# Patient Record
Sex: Female | Born: 1935 | Race: Black or African American | Hispanic: No | Smoking: Never smoker
Health system: Southern US, Community
[De-identification: ages and names within clinical notes are randomized; demographics above are authoritative.]

## PROBLEM LIST (undated history)

## (undated) DIAGNOSIS — I739 Peripheral vascular disease, unspecified: Secondary | ICD-10-CM

## (undated) DIAGNOSIS — M1990 Unspecified osteoarthritis, unspecified site: Secondary | ICD-10-CM

## (undated) DIAGNOSIS — E785 Hyperlipidemia, unspecified: Secondary | ICD-10-CM

## (undated) DIAGNOSIS — I779 Disorder of arteries and arterioles, unspecified: Secondary | ICD-10-CM

## (undated) DIAGNOSIS — I6529 Occlusion and stenosis of unspecified carotid artery: Secondary | ICD-10-CM

## (undated) DIAGNOSIS — I2510 Atherosclerotic heart disease of native coronary artery without angina pectoris: Secondary | ICD-10-CM

## (undated) DIAGNOSIS — I10 Essential (primary) hypertension: Secondary | ICD-10-CM

## (undated) HISTORY — DX: Essential (primary) hypertension: I10

## (undated) HISTORY — DX: Peripheral vascular disease, unspecified: I73.9

## (undated) HISTORY — PX: DOPPLER ECHOCARDIOGRAPHY: SHX263

## (undated) HISTORY — DX: Hyperlipidemia, unspecified: E78.5

## (undated) HISTORY — DX: Disorder of arteries and arterioles, unspecified: I77.9

## (undated) HISTORY — PX: ULTRASOUND GUIDANCE FOR VASCULAR ACCESS: SHX6516

## (undated) HISTORY — DX: Occlusion and stenosis of unspecified carotid artery: I65.29

## (undated) HISTORY — DX: Atherosclerotic heart disease of native coronary artery without angina pectoris: I25.10

---

## 1994-05-20 HISTORY — PX: CORONARY STENT PLACEMENT: SHX1402

## 1997-12-24 ENCOUNTER — Inpatient Hospital Stay (HOSPITAL_COMMUNITY): Admission: AD | Admit: 1997-12-24 | Discharge: 1997-12-25 | Payer: Self-pay | Admitting: Cardiovascular Disease

## 1999-03-16 ENCOUNTER — Other Ambulatory Visit: Admission: RE | Admit: 1999-03-16 | Discharge: 1999-03-16 | Payer: Self-pay | Admitting: *Deleted

## 1999-03-18 ENCOUNTER — Other Ambulatory Visit: Admission: RE | Admit: 1999-03-18 | Discharge: 1999-03-18 | Payer: Self-pay | Admitting: *Deleted

## 1999-03-18 ENCOUNTER — Encounter (INDEPENDENT_AMBULATORY_CARE_PROVIDER_SITE_OTHER): Payer: Self-pay | Admitting: Specialist

## 1999-06-14 ENCOUNTER — Encounter: Payer: Self-pay | Admitting: Cardiovascular Disease

## 1999-06-15 ENCOUNTER — Inpatient Hospital Stay (HOSPITAL_COMMUNITY): Admission: RE | Admit: 1999-06-15 | Discharge: 1999-06-16 | Payer: Self-pay | Admitting: Cardiovascular Disease

## 1999-09-17 ENCOUNTER — Other Ambulatory Visit: Admission: RE | Admit: 1999-09-17 | Discharge: 1999-09-17 | Payer: Self-pay | Admitting: *Deleted

## 1999-09-24 ENCOUNTER — Ambulatory Visit (HOSPITAL_COMMUNITY): Admission: RE | Admit: 1999-09-24 | Discharge: 1999-09-24 | Payer: Self-pay | Admitting: *Deleted

## 1999-09-24 ENCOUNTER — Encounter: Payer: Self-pay | Admitting: *Deleted

## 2000-09-11 ENCOUNTER — Other Ambulatory Visit: Admission: RE | Admit: 2000-09-11 | Discharge: 2000-09-11 | Payer: Self-pay | Admitting: *Deleted

## 2002-01-28 ENCOUNTER — Other Ambulatory Visit: Admission: RE | Admit: 2002-01-28 | Discharge: 2002-01-28 | Payer: Self-pay | Admitting: *Deleted

## 2002-02-17 ENCOUNTER — Inpatient Hospital Stay (HOSPITAL_COMMUNITY): Admission: EM | Admit: 2002-02-17 | Discharge: 2002-02-19 | Payer: Self-pay | Admitting: Emergency Medicine

## 2002-02-17 ENCOUNTER — Encounter: Payer: Self-pay | Admitting: Emergency Medicine

## 2002-06-02 ENCOUNTER — Other Ambulatory Visit: Admission: RE | Admit: 2002-06-02 | Discharge: 2002-06-02 | Payer: Self-pay | Admitting: *Deleted

## 2003-06-09 ENCOUNTER — Other Ambulatory Visit: Admission: RE | Admit: 2003-06-09 | Discharge: 2003-06-09 | Payer: Self-pay | Admitting: *Deleted

## 2004-10-11 ENCOUNTER — Other Ambulatory Visit: Admission: RE | Admit: 2004-10-11 | Discharge: 2004-10-11 | Payer: Self-pay | Admitting: *Deleted

## 2007-02-16 ENCOUNTER — Inpatient Hospital Stay (HOSPITAL_COMMUNITY): Admission: AD | Admit: 2007-02-16 | Discharge: 2007-02-19 | Payer: Self-pay | Admitting: Cardiovascular Disease

## 2007-06-10 ENCOUNTER — Encounter: Admission: RE | Admit: 2007-06-10 | Discharge: 2007-06-10 | Payer: Self-pay | Admitting: Cardiovascular Disease

## 2007-06-16 ENCOUNTER — Inpatient Hospital Stay (HOSPITAL_COMMUNITY): Admission: RE | Admit: 2007-06-16 | Discharge: 2007-06-19 | Payer: Self-pay | Admitting: Cardiovascular Disease

## 2010-10-02 NOTE — Cardiovascular Report (Signed)
NAMESHELL, BLANCHETTE NO.:  [id number]   MEDICAL RECORD NO.:  [id number]          PATIENT TYPE:  INP   LOCATION:  2028                         FACILITY:  [facility]   PHYSICIAN:  Nanetta Batty, M.D.   DATE OF BIRTH:  1929-02-19   DATE OF PROCEDURE:  06/16/2007  DATE OF DISCHARGE:                            CARDIAC CATHETERIZATION   PROCEDURES:  1. Peripheral angiogram.  2. Percutaneous transluminal angioplasty.  3. Stent procedure.   Ms. Hamler is a 77 year old African-American female with history of  CAD and PVOD.  Other problems include mild anemia, dyslipidemia,  hypertension and noncritical carotid disease.  She was complaining of  claudication in her left calf.  We performed Dopplers in our office  revealing what appeared to be high-grade left superficial femoral artery  disease on May 27, 2007.  Her ABI on the left was 0.88 on the right  1.06.  She presents today for abdominal aortography, bifemoral runoff to  define her anatomy, and potentially perform endovascular therapy for  lifestyle-limiting claudication.   DESCRIPTION OF PROCEDURE:  The patient brought to the second floor [facility]  peripheral vascular angiographic suite in the postabsorptive state.  She  was premedicated p.o. Valium.  Right groin was prepped and shaved in the  usual sterile fashion; 1% Xylocaine was used for local anesthesia.  A 5-  French sheath was inserted into the right femoral artery using standard  Seldinger technique.  A 5-French pigtail catheter, crossover end-hole  catheters were used for midstream and distal abdominal aortography with  bifemoral runoff using digital subtraction bolus chase step table  technique in each individual leg.  Visipaque dye was used for the  entirety of the case.  Aortic pressures were monitored during the case.   ANGIOGRAPHIC RESULTS:  1. Abdominal aorta.      a.     Renal arteries:  Normal.      b.     Infrarenal abdominal aorta:  Normal.  2. Left lower extremity:      a.     90% tandem distal left superficial femoral artery stenoses.      b.     Two-vessel runoff with an occluded anterior tibial.  3. Right lower extremity:      a.     A 50% segmental mid-right superficial femoral artery       stenosis.      b.     Two-vessel runoff with occluded anterior tibial.  The       peroneal was dominant on this side.   DESCRIPTION OF PROCEDURE:  The patient received 3000 units of heparin  intravenously.  Contralateral access was obtained with a crossover  catheter, 0.035 Wholly wire.  The 6-French sheath was exchanged for a 7-  [place] crossover trimmer sheath.  The patient received 3000 units of  heparin.  Visipaque dye was used for the entirety of the case.   The guidewire was advanced across the distal stenoses.  Redilatation was  performed with a 4 x 2 Agiltrac balloon nominal pressures.  Following  this, stenting was performed with an EV-3 6 x 2 nitinol  self-expanding  stent in the distal lesion followed by a 6 x 3 stent in the more  proximal lesion.  They were postdilated with a 5.2 balloon resulting in  reduction of 90% distal fairly focal tandem stenoses to 0% residual  without residual stenosis.  There was excellent flow.  The patient  tolerated the procedure well.  The sheath was then withdrawn across the  iliac bifurcation, and an ACT was measured at greater than 200.  The  patient left lab in stable condition.  Plans will be to remove the  sheath once ACT falls below 200.  Pressure will be held.  The patient  will remain recumbent for 6 hours.  She will be gently hydrated,  discharged in the morning.  She will get followup Doppler ABIs after  which time I will see her back in the office for followup.  She left the  lab in stable condition.      Nanetta Batty, M.D.  Electronically Signed     JB/MEDQ  D:  06/16/2007  T:  06/16/2007  Job:  [id number]   cc:   2nd Fl.[facility] Peripheral Angiographiic Suite  [facility]  Wyatt Haste, MD, [place]

## 2010-10-02 NOTE — Discharge Summary (Signed)
NAMENOA, GALVAO NO.:  [id number]   MEDICAL RECORD NO.:  [id number]          PATIENT TYPE:  INP   LOCATION:  3703                         FACILITY:  [facility]   PHYSICIAN:  Ritta Slot, MD     DATE OF BIRTH:  07-26-1928   DATE OF ADMISSION:  02/16/2007  DATE OF DISCHARGE:  02/19/2007                               DISCHARGE SUMMARY   HOSPITAL COURSE:  [name] is a 77 year old African American female,  a patient of Dr. Nanetta Batty, who was transferred from [facility] to [facility] for cardiac catheterization.  She has a  history of coronary artery disease with multiple stents and a history of  an MI in the past.  Her last cath was February 18, 2002; she had patent  stent at that time.  She did have proximal mid circumflex 50% before her  stent.  She had some 50% in-stent restenosis.  She had bifurcation LAD  and diagonal disease, 60% and 90%.  Apparently it was IV ultrasound.  Her LAD was not high-grade.  No intervention was done.  Her last Myoview  was August 27, 2005, which showed an EF of 80%.  She had no ischemia.  She apparently was at home cooking for a church cookout at her house.  She suddenly had severe squeezing chest pain with diaphoresis and then  syncope.  Her level of consciousness waxed and waned until the ambulance  came, when she was more clear.  She was still having squeezing chest  pain.  She was admitted to her [facility].  CPK, MBs  and troponins were negative x2.  She also had a D-dimer that was  negative.  A CT of her abdomen was negative for any acute process.  She  had a CT of the chest without contrast that was negative.  She had  carotid Dopplers done which showed a right ICA stenosis of 50% to 70%.  She had a CT of her head that was also negative.  She was seen by Dr.  Dossie Arbour and it was decided that she should undergo cardiac  catheterization.  She was placed on Lovenox, IV  nitroglycerin, because  she was still having some chest soreness, and she went on for a heart  cath on February 17, 2007.  Her cath showed no high-grade stenosis;  however, her LAD past the apex was subtotaled at the terminal portion.  It was decided her stent had minimal irregularities in the circumflex.  It was decided she should undergo nuclear stress testing.  On February 19, 2007, she underwent a treadmill Myoview; she walked into the second  stage 4 minutes 31 seconds.  She had no EKG changes.  The images  revealed no infarct, no ischemia and her EF was 79%.  She was seen by  Dr. Lynnea Ferrier after the test was complete and considered stable for  discharge home.  He did alter her medications while she was here.  Dr.  Lynnea Ferrier felt that she should see Dr. Allyson Sabal in the office next week and  he could decided  if he wanted to place an event monitor on her.  During  her hospital stay, she had no arrhythmias, she had no further syncope or  presyncope.   LABORATORY DATA:  Hemoglobin 12.5, hematocrit 36.4, WBC 6.3, platelets  235,000.  Her sodium was 141, potassium 3.8, chloride 111, CO2 25, BUN  13, creatinine 0.98 and glucose was 180.  INR was 1.  Her cardiac  enzymes were negative x2.  Total cholesterol was 192, LDL was 134, HDL  was 37 and triglycerides were 104.  TSH was 0.609.   DISCHARGE MEDICATIONS:  1. Metoprolol 50 mg twice a day.  2. Lotrel 5/20 once a day.  3. Demadex 10 mg once a day.  4. Fish oil one capsule in the morning and the p.m.  5. Multivitamin tab one every day.  6. Aspirin 81 mg every day.  Nitroglycerin 1/150 under her tongue      every 5 minutes x3 if needed for chest pain.   DISCHARGE DIAGNOSES:  1. Syncope, probably vasovagal.  2. Chest pain, not cardiac ischemia with a cardiac catheterization      showing no high-grade disease and followup treadmill Myoview      showing no infarct and no ischemia.  Ejection fraction was 79%.  3. Hyperlipidemia, intolerant of  all statins.  4. Hypertension.  5. Intolerance to Plavix.  6. Arteriosclerotic cardiovascular disease with a right internal      carotid artery stenosis of 50% to 70%.  7. Elevated end-diastolic pressure during catheterization of 33.      [name], N.P.      Ritta Slot, MD  Electronically Signed    BB/MEDQ  D:  02/19/2007  T:  02/20/2007  Job:  [id number]   cc:   [facility] Wyatt Haste MD

## 2010-10-02 NOTE — Cardiovascular Report (Signed)
[name], [name]             ACCOUNT NO.:  [id number]   MEDICAL RECORD NO.:  [id number]          PATIENT TYPE:  INP   LOCATION:  3703                         FACILITY:  [facility]   PHYSICIAN:  Thereasa Solo. Little, M.D. DATE OF BIRTH:  12-20-1928   DATE OF PROCEDURE:  02/17/2007  DATE OF DISCHARGE:                            CARDIAC CATHETERIZATION   INDICATIONS FOR TEST:  This 77 year old female has a long history of  coronary disease with stents in her RCA in 1999 and stents in her  circumflex in 2001.  She had an episode of shortness of breath,  diaphoresis and syncope.  Her cardiac markers have been unremarkable.  Her EKG shows no acute changes and she is brought to the cath lab to  rule out ischemia as the explanation for the event.   The patient was asymptomatic, having no pain.   After obtaining informed consent, the patient was prepped and draped in  the usual sterile fashion, exposing the right groin.  Following local  anesthetic with 1% Xylocaine, the Seldinger technique was employed and a  5-French introducer sheath was placed to the right femoral artery.  Left  and right coronary arteriography, ventriculography in the RAO projection  and a distal aortogram were performed.   COMPLICATIONS:  None.   TOTAL CONTRAST USED:  125 mL.   EQUIPMENT:  The 5-French Judkins configuration catheters.  However, a No-  Toe catheter was required to cannulate the RCA.   RESULTS:   HEMODYNAMIC MONITORING:  Her central aortic pressure was 210/108.  Her  left ventricular pressure was 205/29.  She was given 20 mg of IV  labetalol and her systolic pressure gradually decreased to 140.   VENTRICULOGRAPHY:  Ventriculography in the RAO projection using 25 mL of  contrast at 12 mL per second revealed marked ectopy.  There was ectopy-  induced mild mitral regurgitation seen.  The left ventricle was markedly  trabeculated.  The ejection fraction was well in excess of 60% and there  was very  tense mitral annular calcification noted on fluoroscopy.   DISTAL AORTOGRAM:  A distal aortogram done at the level of the renal  arteries showed no evidence of renal artery stenosis and no evidence of  an aortic aneurysm.   CORONARY ARTERIOGRAPHY:  1. The left main was normal; it bifurcated.  2. Circumflex:  The circumflex was a very large vessel; it gave rise      to a very large proximal first OM that was at least 3 mm in      diameter, very tortuous and it bifurcated.  There were minor      irregularities in this system.  The ongoing circumflex and OM #2      were both large vessels.  There was a stent in the ongoing      circumflex before the OM #2 that had only minimal irregularities.      The distal OM was free of significant disease.  3. LAD:  The LAD was tortuous.  It extended down and well past the      apex of the heart.  At the  terminal portion of the LAD was an area      that was subtotaled.  This was too small for any type of      intervention; the vessel at that point was about 1 mm in diameter.      The proximal LAD had 30% areas of narrowing and some mild      irregularities in the midportion.  The diagonal was free of      disease.  4. Right coronary artery:  This was a very tortuous vessel.  There was      a stent right at the acute margin of the heart that was patent.      The was an area of 40% narrowing in the midportion and an area of      50% narrowing distal to the stent.  The ostium of the PDA had an      area of 40% narrowing.  The PDA was tortuous and free of high-grade      stenosis.   CONCLUSION:  1. Marked systolic hypertension.  2. Left ventricular hypertrophy.  3. Normal left ventricular systolic function.  4. Elevated left ventricular end-diastolic pressure.  5. No high-grade critical stenosis in her coronary arteries with      patent stents.   DISCUSSION:  I was concerned about the distal right coronary artery.  I  had Dr. Jenne Campus evaluate the  angiograms with me and neither one of [place]  felt that was critical, but are concern that it could potentially be a  source of symptoms.  We will plan an outpatient Cardiolite on her.  She  needs aggressive management of her hypertension.           ______________________________  Thereasa Solo Little, M.D.     ABL/MEDQ  D:  02/17/2007  T:  02/18/2007  Job:  [id number]   cc:   Nanetta Batty, M.D.  [place], [place] Wyatt Haste MD  [facility]

## 2010-10-02 NOTE — Discharge Summary (Signed)
NAMEJOHNELLA, CRUMM NO.:  [id number]   MEDICAL RECORD NO.:  [id number]          PATIENT TYPE:  INP   LOCATION:  2028                         FACILITY:  [facility]   PHYSICIAN:  Nanetta Batty, M.D.   DATE OF BIRTH:  Jul 11, 1928   DATE OF ADMISSION:  06/16/2007  DATE OF DISCHARGE:  06/19/2007                               DISCHARGE SUMMARY   DISCHARGE DIAGNOSES:  1. Peripheral vascular disease status post left superficial femoral      artery stenting.  2. Coronary artery disease with previous percutaneous coronary      interventions.  3. Dyslipidemia.  4. Mild anemia.  5. Hypertension.  6. Obesity.  7. Mild renal insufficiency.   HISTORY:  This is a 77 year old pleasant African-American lady who was  seen in our office with complaints of claudication and underwent  arterial Doppler ultrasound of the lower extremities that revealed high-  grade stenosis of the left superficial femoral artery.  The patient was  admitted to the hospital for peripheral vascular angiography and  possible intervention.   On June 16, 2007, Dr. Allyson Sabal performed PV angio that showed 90%  stenosis, segmental stenosis of the distal left SFA with two-vessel  runoff.  On the right side, it was 50% stenosis of the mid segment of  the right SFA and also two-vessel runoff.  He performed angioplasty and  stenting of the SFA on the left side with reduction of the lesion from  90-0%.  The patient tolerated the procedure well, but later in the  evening she developed episodes of significant hypotension.  Blood  pressure dropped to 80s.  We gave her fluid resuscitation therapy and  her blood pressure improved.  The next morning, it was in the 90s.  The  patient did not have any significant complaints while lying in the bed,  but when she was trying to sit up and have her breakfast, she had  episodes of dizziness and weakness.  She was kept the hospital for an  extra day for observation.  She  ambulated without difficulties and her  blood pressure remained stable.  The next morning, her blood pressure  was 112/47, but the patient developed some mild renal insufficiency and  was held in the hospital for 1 more day for IV hydration therapy.  On  June 19, 2007, she was seen by Dr. Domingo Sep and considered to be  stable for discharge home.  Her blood pressure was 127/74.   LABORATORY DATA:  Hemoglobin 13.0, hematocrit 37.8, white blood cell  count 6.8, platelet count 204,  sodium 137, potassium 3.3, chloride 102,  CO2 26, BUN 13, creatinine 1.08, glucose 156.   The following day after we replaced her potassium, her BMP showed sodium  137, potassium 4.5, CO2 24, chloride 106, BUN 24, creatinine 1.67 and  glucose 163.   DISCHARGE MEDICATIONS:  1. Fish oil 1000 mg b.i.d.  2. Multivitamins 1 pill daily.  3. Aspirin 81 mg daily.  4. Bystolic 15 mg daily.  5. Stop the Exforge.  6. Lidopen 10 mg daily.  7. The patient was instructed to stop the  __________ and amlodipine.   DISCHARGE INSTRUCTIONS:  1. She will need to have blood work in 1 week to make sure her renal      function remains within normal limits.  2. She will see Dr. Allyson Sabal in the clinic on July 02, 2007 9:30 a.m.  3. Prior to that appointment, the patient will have arterial      ultrasound of the left lower extremity and office will contact the      patient to set up appointment.      Raymon Mutton, P.A.      Nanetta Batty, M.D.  Electronically Signed    MK/MEDQ  D:  06/19/2007  T:  06/19/2007  Job:  [id number]   cc:   Nanetta Batty, M.D.

## 2010-10-05 NOTE — Cardiovascular Report (Signed)
NAME:  [name], PODOLSKI                       ACCOUNT NO.:  [id number]   MEDICAL RECORD NO.:  [id number]                   PATIENT TYPE:  INP   LOCATION:  2015                                 FACILITY:  [facility]   PHYSICIAN:  Runell Gess, M.D.             DATE OF BIRTH:  1929/05/02   DATE OF PROCEDURE:  02/18/2002  DATE OF DISCHARGE:                              CARDIAC CATHETERIZATION   INDICATIONS FOR PROCEDURE:  The patient is a 77 year old black female with  history of hypertension, hyperlipidemia, and PVOD.  She had RCA intervention  in 1999 and circumflex intervention in January of 2001.  She was admitted on  October 1 with unstable angina, ruled out for myocardial infarction.  She  presents now for diagnostic coronary arteriography to rule out ischemic  etiology.   DESCRIPTION OF PROCEDURE:  The patient was brought to the second floor [facility] cardiac catheterization lab in the postabsorptive state.  She was  premedicated with p.o. Valium.  Her right groin was prepped and draped in  the usual sterile fashion.  1% Xylocaine was used for local anesthesia.  A 6  French sheath was inserted into the right femoral artery using standard  Seldinger technique.  6 French right and left Judkins diagnostic catheter as  well as a 6 French catheter were used for selective coronary angiography,  left ventriculography, respectively.  A No-Torque catheter was used to  visualize the right coronary.  Omnipaque dye was used for the entirety of  the case.  Retrograde aortic, left ventricular, and pullback pressures were  recorded.   HEMODYNAMIC DATA:  Aortic systolic pressure 151, diastolic pressure 77.  Left ventricular systolic pressure 150, end diastolic pressure 19.   ANGIOGRAPHIC DATA:  1. Left main normal.  2. LAD.  The LAD had a 50% eccentric hypodense lesion at the junction     between the proximal and mid third.  There was a 90% ostial small     diagonal branch that arose  from that lesion.  This is unchanged from the     primary angiogram.  3. Left circumflex.  This vessel had 50% segmental hypodense proximal     lesion. The proximal and distal stents were widely patent. There was a     small second obtuse marginal graft that had a 60% proximal stenosis.  4. Right coronary artery.  The distal stent was widely patent.  There is a     50 to 60% distal PLA lesion.  There was 40% stenosis with segmental in     the midportion.  5. Left ventriculography.  RAO left ventriculogram was performed using 20 cc     of Omnipaque dye at 10 cc per second. The overall LVEF was estimated at     greater than 70% with cavity obliteration.  Fluoroscopy did suggest heavy     mitral annular calcification.   IMPRESSION:  Widely patent stents with  a question of a 50% hypodense  segmental lesion in the proximal circumflex.  I am going to perform  intravascular ultrasound to assess true plaque burden.   The patient received an additional 3500 units of heparin and the 6 French  sheath was exchanged over a wire for a 7 [place] sheath.  The ACT was  measured at 277.  Using a 7 [place] JL3.5 guide catheter along with an 0.14  190 split guide wire and a 3.2 [place] Atlantis intravascular ultrasound  catheter, IVUS was performed using automated pullback technique.  There was  very little neo interval hyperplasia within the proximal segments.  The  proximal circumflex measured 7 mmsq using intravascular ultrasound.  The  stented segment measured 3.3 mm.   IMPRESSION:  The patient has noncritical CAD with normal LV function.  The  etiology of her chest pain was still unclear.  Medical therapy will be  recommended.   The guide wire and catheters were removed.  The sheath was sewn securely in  place. The patient left the lab in stable condition.   PLAN:  For sheath removal once the ACT falls below 150.  The patient will  most likely be discharged home in the morning.                                                Runell Gess, M.D.    JJB/MEDQ  D:  02/18/2002  T:  02/22/2002  Job:  [id number]   cc:   [place], [place]  [id number] Wyatt Haste, M.D. [address]

## 2010-10-05 NOTE — H&P (Signed)
[place]. [facility]  Patient:    [name], [name]                      MRN: [id number] Adm. Date:  06/13/99 Attending:  Runell Gess, M.D. Dictator:   Mancel Bale, P.A. CC:         Runell Gess, M.D.                         History and Physical  HISTORY OF PRESENT ILLNESS:  Ms. [name] is a 77 year old African-American female with a history of coronary artery disease, status post catheterization on December 23, 1997.  She had percutaneous interventional angioplasty to the right coronary at that time, and also known residual circumflex and LAD disease at that time.  Additionally she has a history of HOCM.  She also has known right internal carotid disease.  She also had hypertension and hyperlipidemia, all of which Dr. Allyson Sabal follow.  Today, Ms. [name] presents for follow-up evaluation.  She states that she has  been experiencing some episodes of chest pain.  She states that last Friday night she did not sleep well.  On Saturday morning she woke up and began to stir around, she then developed a substernal chest pain, which she describes as a "chest heaviness" plus her tightness.  This radiated to her left neck.  As well, she did have an increased amount of shortness of breath as well as diaphoresis and nausea, but no vomiting.  She took one sublingual nitroglycerin.  She then waited a while and took a second nitroglycerin.  At that time she was sitting in a recliner, she states that she then "passed out", and when she woke up her clothes were wet with sweat.  She states that during the rest of the day Saturday she had very limited activity and rested.  She stated that the chest pain continued at a very mild amount.  She states that at the worst it was approximately an 8/10, and then throughout the rest of the day it was about 3/10.  She states she took no further nitroglycerin.  She states during the night Saturday she did  not wake with chest pain or dyspnea.  On Sunday she had an increased amount of fatigue, and continued to have some slight chest tightness.  She states that each day since then she has felt a little bit better and has had very little tightness today.  Ms. [name] states that over the past several months she has had some chest pain off and on.  She states that those times the chest pain was with activity and exercise.  She says that she was doing aerobics and would sometimes develop chest pain.  She would then stop the aerobics and the pain would improve but would not completely resolve.  She states that she has had no other episodes of frank syncope, but does sometime have some lightheadedness.  PAST MEDICAL HISTORY: 1. Coronary artery disease.    a. Status post cardiac catheterization on December 23, 1997; with percutaneous       interventional angioplasty to the right coronary artery.  At that       catheterization she also had residual left circumflex disease, as well as       LAD disease.  In each of these vessels she had one area of 70% stenosis in       in the LAD,  and had two different areas (one more proximal and one more  distal) of 80% in the left circumflex. 2. Hypertrophic obstructive cardiomyopathy. 3. Right internal carotid disease. 4. Hypertension. 5. Hyperlipidemia.  PAST SURGICAL HISTORY:  None.  CURRENT MEDICATIONS: 1. Lotrel 10/20 mg q.d. 2. Demodex 20 mg q.d. 3. Lipitor 40 mg q.d. 4. Baby aspirin 81 mg q.d. 5. Atenolol 25 mg q.d.  ALLERGIES:  No known drug allergies and no allergy to contrast dye or catheter.  SOCIAL HISTORY:  Ms. [name] was alone.  Her family lives close by.  She has a son in [place], a daughter here in [place], and another daughter in [place]. She has two sons, two daughters and eight grandchildren.  She states she never smoked.  FAMILY HISTORY:  Noncontributory.  REVIEW OF SYSTEMS:  See history of present illness.   No stroke, no TIA, no MI, o peptic ulcer disease, no GI bleed.  PHYSICAL EXAMINATION:  VITAL SIGNS:  Weight 186, blood pressure 126/80, pulse 66.  HEART:  Regular rhythm.  She has no murmurs, rubs, gallops or clicks.  LUNGS:  Clear throughout.  NECK:  Supple without JVD.  She has 2+ bilateral carotid pulses with no bruit.  ABDOMEN:  Soft with no mass, tenderness or organomegaly; no bruits, no pulsatile mass.  EXTREMITIES:  She has 2+ bilateral femoral pulses without bruits, and 2+ dorsalis pedis and posterior tibial pulses bilaterally; no lower extremity edema.  EKG:  Reveals normal sinus rhythm at 66 beats per minute, with no ST or T-wave changes.  IMPRESSION AND PLAN: 1. Unstable angina.  At this time we are planning for Ms. [name] to undergo    cardiac catheterization tomorrow.  We are giving her sublingual nitroglycerin    to take as directed for chest pain.  As well, we are giving her samples of    Plavix 75 mg to be taking.  As well she is to continue her aspirin and    other medications. 2. History of hypertrophic obstructive cardiomyopathy.  We are obtaining an    echocardiogram in the office now to evaluate this prior to her catheterization. 3. Right internal carotid disease. 4. Hypertension. 5. Hyperlipidemia. DD:  06/13/99 TD:  06/13/99 Job: [id number]

## 2010-10-05 NOTE — Cardiovascular Report (Signed)
[place]. [facility]  Patient:    [name]                     MRN: [id number] Proc. Date: 06/14/99 Adm. Date:  [id number] Attending:  Berry, Jonathan [place] CC:         [facility]             Cardiac Catheterization Laboratory             Wyatt Haste, M.D., [place], [place]                        Cardiac Catheterization  INDICATIONS:  [name] is a 77 year old moderately overweight, African-American female, with a history of CAD, status post cardiac catheterization December 23, 1997. At that time she had PTCA of the distal right and had documentation of moderate LAD and circumflex disease.  She has known cavity obliteration but does not have hypertrophic obstructive cardiomyopathy physiology.  She also has known right internal carotid artery disease.  Her other problems are hypertension and hyperlipidemia.  I saw her in the office on January 24 at which time she related a history of over the few days prior to admission consistent with unstable angina. She is placed on Plavix in addition to aspirin and presents now for diagnostic coronary arteriography and potential intervention.  DESCRIPTION OF PROCEDURE:  The patient was brought to the second floor [facility] in the postabsorptive state.  She was premedicated ith p.o. Valium and IV Valium as well as IV Versed.  Her right groin was prepped and shaved in the usual sterile fashion.  Xylocaine 1% was used for local anesthesia. A 6 upgraded to an 7-French sheath was inserted into the right femoral artery using standard Seldinger technique.  A 6-French right and left Judkins catheters along with 6-French pigtail catheter were used for selective coronary angiography, left ventriculography, subselective left internal mammary artery angiography, and distal abdominal aortography.  Omnipaque dye was used for the entirety of the diagnostic  case.  Retrograde, aortic, left ventricular and pullback pressures were recorded.  HEMODYNAMICS: 1. Aortic systolic pressure 150, diastolic pressure 59. 2. Left ventricular systolic pressure 154, diastolic pressure 28.  SELECTIVE CORONARY ANGIOGRAPHY: 1. Left main:  Normal. 2. Left anterior descending:  The LAD had a 60-70% hypodensed lesion in its    proximal portion at the takeoff of the first diagonal branch.  This diagonal    branch was a small vessel and had 90% ostial stenosis. 3. Left circumflex:  The left circumflex gave off a high moderate sized bifurcatiNG    marginal branch in the ramus distribution.  This had a 40-50% proximal    segmental hypodensed lesion which did not appear to be hemodynamically    significant. 4. Left circumflex:  This vessel gave off a second and third marginal branch.    There was an 80% hypodense lesion in the proximal circumflex between OM-1 and    2 and another 80% segmental stenosis between OM-2 and 3.  These appeared to    have progressed slightly in over the last two years. 4. Right coronary artery:  This vessel is a large dominant vessel with at most    40% segmental mid to distal stenosis.  The site of PTCA was widely patent.  LEFT VENTRICULOGRAPHY:  The RAO left ventriculogram was performed using 20 cc of Omnipaque dye, 10 cc/second.  The  overall LVEF was estimated at greater than 70% with mid cavity obliteration.  Fluoroscopically, there appeared to be significant mitral annular calcification.  There also appeared to be a mitral valve prolapse.  DISTAL ABDOMINAL AORTOGRAPHY:  Distal abdominal aortogram was performed using 30 cc of Omnipaque dye, 20 cc/second.  The renal arteries were widely patent. The infrarenal abdominal aorta and biliary bifurcation appear free of significant atherosclerotic change.  LEFT INTERNAL MAMMARY ARTERY:  This vessel is subselectively visualized and was  widely patent.  It is suitable for use during  coronary artery bypass grafting if required.  IMPRESSION:  Ms. [name] distal right coronary artery site remains widely patent.  She does have a hypodense lesion in her proximal left anterior descending which does not appear to have progressed in the last two years.  Her circumflex  lesions appear to me to be somewhat more worrisome.  Given her symptoms, we will proceed with PCI of her circumflex.  DESCRIPTION OF PROCEDURE:  Using a 7-French JR4 guide catheter along with an OM4 190 ACS Sport wire, the 2.5 x 20 mm long ACS CrossSail balloon PTCA was performed on the proximal and distal lesions.  The patient received 4000 units f heparin with an ending ACT documented at 40.  Integrilin double bolus infusion was administered.  The patient was on aspirin and Plavix.  Predilatation was performed of both the proximal and distal circumflex lesions.  A 2.5 x 15 mm long S660 stents were then deployed at 12 and 14 atmospheres resulting reduction of 80% proximal and mid to distal circumflex hypodensed lesions to 0% residual.  Intracoronary nitroglycerin of 200 mg were given several times during the case.  The patient id complain of some chest and arm pain through balloon inflation similar to her symptoms but these resolved quickly upon balloon deflation.  OVERALL IMPRESSION:  Successful proximal and mid circumflex percutaneous transluminal coronary angioplasty and stenting in the setting of unstable angina. The patient does have residual proximal left anterior descending disease, which did not appear to be hemodynamically significant.  The sheaths were sewn securely in place and the patient left the lab in stable condition.  Heparin will be discontinued and the sheaths will be removed once the ACT falls below 150. Integrilin will be continued for 18 hours.  She will be discharged home in the morning if she remains clinically stable overnight on aspirin and Plavix with close office  followup.  Dr. Duane Lope office was notified of these results. DD:  06/14/99 TD:  06/15/99 Job: [id number]

## 2010-10-05 NOTE — Discharge Summary (Signed)
   NAMEMarland Kitchen  LIV, RALLIS                       ACCOUNT NO.:  [id number]   MEDICAL RECORD NO.:  [id number]                   PATIENT TYPE:  INP   LOCATION:  2015                                 FACILITY:  [facility]   PHYSICIAN:  Runell Gess, M.D.             DATE OF BIRTH:  [birth year]   DATE OF ADMISSION:  02/17/2002  DATE OF DISCHARGE:  02/19/2002                                 DISCHARGE SUMMARY   DISCHARGE DIAGNOSES:  1. Chest pain consistent with unstable angina.  Catheterization this     admission revealing patent stent sites.  2. Coronary artery disease, two sides with circumflex stenting in 2001.  3. Non-insulin dependent diabetes mellitus.  4. Treated hyperlipidemia.  5. Peripheral vascular disease with history of carotid stenosis.  6. Controlled hypertension.   HOSPITAL COURSE:  The patient is a 77 year old female followed by Dr. Allyson Sabal  with a history of two side circumflex stenting in 2001.  She presented with  chest tightness to the office off and on for two weeks.  She was admitted  for further evaluation.  Initial enzymes were negative.  She was started on  IV heparin and set up for diagnostic catheterization which was done on  February 18, 2002.  This revealed a 40% mid RCA and a patent distal RCA stent  site.  The circumflex stent sites were patent with 50% narrowings.  The LAD  had a 60% narrowing and a 90% at the first diagonal.  IVUS of the proximal  circumflex revealed patent vessel.  The plan is for continued medical  therapy.  She had normal LV function.  We felt she can be discharged on  February 19, 2002.   DISCHARGE MEDICATIONS:  1. Coated aspirin q.d.  2. Lipitor 80 mg a day.  3. Lotrel 5/10 twice a day.  4. Atenolol 25 mg a day.  5. Metformin 1 g a day.  She will start this in two days.  6. Demodex 20 mg a day.  7. Prevacid has been added.   DISCHARGE LABORATORY DATA AND X-RAY FINDINGS:  White count 6.8, hemoglobin  11.8, hematocrit 35.1, platelets  233.  Sodium 142, potassium 3.5, BUN 21,  creatinine 1.1.  CK-MB and troponins were negative x3.  Lipid profile was  obtained and the results are pending at discharge.   Chest x-ray showed negative disease.  EKG shows sinus rhythm without acute  changes.    CONDITION ON DISCHARGE:  Stable.   FOLLOW UP:  Follow up with Dr. Allyson Sabal on October 22, at 2 p.m.      Abelino Derrick, P.A.                      Runell Gess, M.D.    Lenard Lance  D:  02/19/2002  T:  02/23/2002  Job:  [id number]   cc:   Wyatt Haste, M.D.

## 2010-12-18 ENCOUNTER — Other Ambulatory Visit: Payer: Self-pay | Admitting: Cardiovascular Disease

## 2010-12-18 ENCOUNTER — Ambulatory Visit
Admission: RE | Admit: 2010-12-18 | Discharge: 2010-12-18 | Disposition: A | Discharge status: Home / Self Care | Payer: Medicare Other | Source: Ambulatory Visit | Attending: Cardiovascular Disease | Admitting: Cardiovascular Disease

## 2010-12-18 DIAGNOSIS — Z01811 Encounter for preprocedural respiratory examination: Secondary | ICD-10-CM

## 2010-12-18 DIAGNOSIS — I2510 Atherosclerotic heart disease of native coronary artery without angina pectoris: Secondary | ICD-10-CM

## 2010-12-25 ENCOUNTER — Inpatient Hospital Stay (HOSPITAL_COMMUNITY)
Admission: RE | Admit: 2010-12-25 | Discharge: 2010-12-26 | DRG: 301 | Disposition: A | Discharge status: Home / Self Care | Payer: Medicare Other | Source: Ambulatory Visit | Attending: Cardiovascular Disease | Admitting: Cardiovascular Disease

## 2010-12-25 DIAGNOSIS — E785 Hyperlipidemia, unspecified: Secondary | ICD-10-CM | POA: Diagnosis present

## 2010-12-25 DIAGNOSIS — Z9861 Coronary angioplasty status: Secondary | ICD-10-CM

## 2010-12-25 DIAGNOSIS — I10 Essential (primary) hypertension: Secondary | ICD-10-CM | POA: Diagnosis present

## 2010-12-25 DIAGNOSIS — T50995A Adverse effect of other drugs, medicaments and biological substances, initial encounter: Secondary | ICD-10-CM | POA: Diagnosis not present

## 2010-12-25 DIAGNOSIS — Z7982 Long term (current) use of aspirin: Secondary | ICD-10-CM

## 2010-12-25 DIAGNOSIS — I739 Peripheral vascular disease, unspecified: Principal | ICD-10-CM | POA: Diagnosis present

## 2010-12-25 DIAGNOSIS — I2510 Atherosclerotic heart disease of native coronary artery without angina pectoris: Secondary | ICD-10-CM | POA: Diagnosis present

## 2010-12-25 DIAGNOSIS — I6529 Occlusion and stenosis of unspecified carotid artery: Secondary | ICD-10-CM | POA: Diagnosis present

## 2010-12-25 DIAGNOSIS — Z8673 Personal history of transient ischemic attack (TIA), and cerebral infarction without residual deficits: Secondary | ICD-10-CM

## 2010-12-25 DIAGNOSIS — R079 Chest pain, unspecified: Secondary | ICD-10-CM | POA: Diagnosis not present

## 2010-12-25 LAB — CBC
HCT: 39.2 % (ref 36.0–46.0)
MCH: 29.8 pg (ref 26.0–34.0)
MCV: 87.1 fL (ref 78.0–100.0)
Platelets: 224 10*3/uL (ref 150–400)
RDW: 12.7 % (ref 11.5–15.5)
WBC: 6.3 10*3/uL (ref 4.0–10.5)

## 2010-12-25 LAB — POCT ACTIVATED CLOTTING TIME: Activated Clotting Time: 193 seconds

## 2010-12-25 LAB — URINALYSIS, MICROSCOPIC ONLY
Hgb urine dipstick: NEGATIVE
Leukocytes, UA: NEGATIVE
Protein, ur: NEGATIVE mg/dL
Specific Gravity, Urine: 1.01 (ref 1.005–1.030)
Urobilinogen, UA: 0.2 mg/dL (ref 0.0–1.0)

## 2010-12-25 LAB — APTT: aPTT: 27 seconds (ref 24–37)

## 2010-12-26 LAB — CBC
MCH: 28.7 pg (ref 26.0–34.0)
MCHC: 32.8 g/dL (ref 30.0–36.0)
Platelets: 263 10*3/uL (ref 150–400)
RDW: 12.7 % (ref 11.5–15.5)

## 2010-12-26 LAB — URINE CULTURE
Colony Count: NO GROWTH
Culture  Setup Time: 201208072320
Culture: NO GROWTH

## 2010-12-26 LAB — CARDIAC PANEL(CRET KIN+CKTOT+MB+TROPI)
CK, MB: 4.6 ng/mL — ABNORMAL HIGH (ref 0.3–4.0)
Total CK: 97 U/L (ref 7–177)

## 2010-12-26 LAB — BASIC METABOLIC PANEL
BUN: 25 mg/dL — ABNORMAL HIGH (ref 6–23)
Calcium: 9.4 mg/dL (ref 8.4–10.5)
Creatinine, Ser: 1.09 mg/dL (ref 0.50–1.10)
GFR calc Af Amer: 59 mL/min — ABNORMAL LOW (ref >60)
GFR calc non Af Amer: 48 mL/min — ABNORMAL LOW (ref >60)
Glucose, Bld: 229 mg/dL — ABNORMAL HIGH (ref 70–99)

## 2010-12-26 LAB — POCT ACTIVATED CLOTTING TIME: Activated Clotting Time: 276 seconds

## 2010-12-27 LAB — POCT ACTIVATED CLOTTING TIME: Activated Clotting Time: 166 s

## 2011-01-04 NOTE — Procedures (Signed)
[name], Dougherty NO.:  [id number]  MEDICAL RECORD NO.:  [id number]  LOCATION:  2902                         FACILITY:  [facility]  PHYSICIAN:  Nanetta Batty, M.D.   DATE OF BIRTH:  10-12-1928  DATE OF PROCEDURE: DATE OF DISCHARGE:                   PERIPHERAL VASCULAR INVASIVE PROCEDURE   [name] is a 77 year old divorced African American female, mother of four, grandmother of 8 grandchildren.  I last saw her in the office on December 09, 2010.  She has had a CVA in the past with multiple interventions and coronary circulation including the mid circumflex distal right coronary artery.  Last cath by Dr. Clarene Duke in September 2008 revealed minimal progression of disease.  Other problems include PVD status post stenting of her mid and distal left SFA with improvement of claudication by Dopplers.  Other problems include hypertension, hyperlipidemia, moderate right internal carotid artery stenosis.  She is complaining of right lower extremity lifestyle-limiting claudication with decrease in her right ABI and high-frequency signal in the mid right SFA.  She presents now for angiography and potential intervention.  PROCEDURE DESCRIPTION:  The patient was brought to the Second Floor [facility] Angiographic Suite in the post absorptive state.  She was premedicated with p.o. Valium.  Her left groin was prepped and shaved in the usual sterile fashion.  Xylocaine 1% was used for local anesthesia. A 5-French sheath was inserted in the right femoral artery using standard Seldinger technique.  5-French tennis racket catheter was used for distal abdominal aortography, bifemoral runoff using bolus chase digital subtraction step table technique.  Visipaque dye was used for the entirety of the case.  Retrograde aortic pressure was monitored during the case.  The patient did receive 10 mg of hydralazine for elevated pressure in the beginning of the case.  ANGIOGRAPHIC RESULTS: 1.  Abdominal aorta.     a.     Renal arteries - normal.     b.     Infrarenal abdominal aorta - normal. 2. Left lower extremity.     a.     Patent mid and distal short stented segments from 2009 with      one-vessel runoff via the posterior tibial. 3. Right lower extremity.     a.     Tandem 80-90% calcified mid right SFA stenoses.     b.     Two-vessel runoff with occluded anterior tibial.  PROCEDURE DESCRIPTION:  Contralateral access was obtained with a 5- [place] crossover catheter and 7-French [place] multipurpose sheath. The patient received 5000 units of heparin intravenously with an ACT documented at 276.  She received total of 167 mL of contrast.  I crossed the lesion with a quick cross catheter and wire exchanged for a viper wire.  I delivered a NAV 6 filter for distal protection anticipating diamondback orbital rotational atherectomy and PTA plus/minus stenting. Unfortunately, the patient developed hypotension, chest pain, and right leg pain of unclear etiology.  She did receive atropine, was given IV fluid bolus along with IV dopamine.  Her pressures slowly improved as did her symptoms.  In the event that this was contrast reaction, she received Solu-Medrol, Benadryl, and Pepcid.  The procedure was aborted and the filter was retrieved.  The sheath  was then withdrawn across the bifurcation and exchanged for short 7-French sheath.  The sheath will be removed once the ACT falls below 150.  IMPRESSION:  Peripheral angiogram revealing a patent left  superficial femoral artery stents with high-grade calcified mid right  superficial femoral artery stenoses, aborted because of hypotension and chest pain. The patient will be hydrated.  Enzymes will be cycled.  She will reside overnight in the CCU for close observation and will be discharged home in the morning if she remains clinically stable.  I will bring her back for staged elective right  superficial femoral artery intervention  after being premedicated with contrast allergy prophylaxis.     Nanetta Batty, M.D.     Cordelia Pen  D:  12/25/2010  T:  12/26/2010  Job:  [id number]  cc:   Second Floor [facility]  Electronically Signed by Nanetta Batty M.D. on 01/04/2011 03:35:59 PM

## 2011-01-07 NOTE — Discharge Summary (Signed)
[name], [name]             ACCOUNT NO.:  [id number]  MEDICAL RECORD NO.:  [id number]  LOCATION:  2902                         FACILITY:  [facility]  PHYSICIAN:  Thurmon Fair, MD     DATE OF BIRTH:  Aug 04, 1928  DATE OF ADMISSION:  12/25/2010 DATE OF DISCHARGE:  12/26/2010                              DISCHARGE SUMMARY   DISCHARGE DIAGNOSES: 1. Peripheral vascular disease, requiring a stage elective right     superficial femoral artery percutaneous coronary intervention in     the near future. 2. Suspected contrast ED allergy during peripheral vascular angiogram. 3. History of cerebrovascular accident. 4. Coronary artery disease, prior percutaneous coronary intervention     to mid circumflex, distal right coronary artery. 5. Hypertension. 6. Hyperlipidemia. 7. Moderate right carotid stenosis.  HOSPITAL COURSE:  [name] is a 77 year old African American female with a history of CAD, multiple interventions to the proximal mid circumflex as well as a distal right coronary artery, most recent cath was performed by Dr. Clarene Duke in February 17, 2007, showed minimal progression of disease.  Her other history includes peripheral vascular disease status post stenting to her left mid and distal SFA, hypertension, hyperlipidemia, moderate right carotid artery stenosis. Dopplers on lower extremities performed on November 07, 2010, showed a right ABI of 0.76 which is decreased from 1 year ago which was 0.88.  She presented for peripheral angiogram which revealed a patent left superficial femoral artery stent, high-grade calcified mid right. Superficial femoral artery stenosis, this was aborted because of hypertension and chest pain.  The patient was hydrated.  Enzymes were cycled and negative.  She stayed in the CCU overnight for observation. She was given 25 mg p.o. Benadryl as well as 25 mg IV stat Benadryl, 20 mg of p.o. stat prednisone.  The patient had no complaints overnight. No  further hypotension.  The patient had been seen by Dr. Royann Shivers who feels she is stable for discharge home today.  The patient initially had some trouble urinating overnight, however, this has been resolved.  DISCHARGE LABS:  WBCs 15.3, hemoglobin 12.1, hematocrit 36.9, platelets 263.  Sodium 138, potassium 4.7, chloride 103, carbon dioxide 22, glucose 229, BUN 25, creatinine 1.09, calcium 9.4.  Urinalysis showed ketones of 15, negative nitrite, negative leukocytes, rare bacteria.  STUDIES/PROCEDURES: 1. Peripheral vascular angiogram, December 25, 2010.  Angiographic     results:  Normal renal arteries.  Normal infrarenal abdominal     aorta.  Left lower extremity has patent mid and distal short     stented segments from 2009.  One-vessel runoff via the posterior     tibial.  Right lower extremity showed 80-90% calcified mid right     SFA stenosis.  Two-vessel runoff with occluded anterior tibial.     Peripheral angiogram revealed patent left superficial femoral     artery stents with high-grade calcified mid right superficial     femoral artery stenosis.  Procedure was aborted because of     hypertension and chest pain. She was hydrated.  Cardiac enzymes     were checked.  DISCHARGE MEDICATIONS: 1. Amlodipine 5 mg 1 tablet by mouth daily. 2. Aspirin 325 mg 1 tablet by mouth  daily. 3. Bystolic 10 mg 1 tablet by mouth daily. 4. Demadex 20 mg 1 tablet by mouth daily. 5. Fish oil over the counter 1 tablet by mouth daily. 6. Losartan 50 mg 1 tablet by mouth daily. 7. Vitamin D3 over the counter 1 tablet by mouth daily. 8. Benadryl 25 mg q.4 hours p.r.n. for itching.  DISPOSITION:  [name] was discharged home in stable condition.  It was recommended she increase activity slowly.  She may shower and bathe. No lifting or driving for 3 days.  It was recommended she eat a heart- healthy diet.  If catheter site becomes red, painful swollen, discharges fluid or pus, she is to call our  office immediately.  She will return for elective right SFA PTA plus or minus stenting and our office will call her and schedule that time.    ______________________________ Wilburt Finlay, PA   ______________________________ Thurmon Fair, MD    BH/MEDQ  D:  12/26/2010  T:  12/26/2010  Job:  [id number]  Electronically Signed by Wilburt Finlay PA on 01/04/2011 03:22:07 PM Electronically Signed by Thurmon Fair M.D. on 01/07/2011 06:31:37 PM

## 2011-01-15 ENCOUNTER — Ambulatory Visit (HOSPITAL_COMMUNITY)
Admission: RE | Admit: 2011-01-15 | Discharge: 2011-01-16 | Disposition: A | Discharge status: Home / Self Care | Payer: Medicare Other | Source: Ambulatory Visit | Attending: Cardiovascular Disease | Admitting: Cardiovascular Disease

## 2011-01-15 DIAGNOSIS — I2510 Atherosclerotic heart disease of native coronary artery without angina pectoris: Secondary | ICD-10-CM | POA: Insufficient documentation

## 2011-01-15 DIAGNOSIS — E663 Overweight: Secondary | ICD-10-CM | POA: Insufficient documentation

## 2011-01-15 DIAGNOSIS — I10 Essential (primary) hypertension: Secondary | ICD-10-CM | POA: Insufficient documentation

## 2011-01-15 DIAGNOSIS — Z01812 Encounter for preprocedural laboratory examination: Secondary | ICD-10-CM | POA: Insufficient documentation

## 2011-01-15 DIAGNOSIS — Z9861 Coronary angioplasty status: Secondary | ICD-10-CM | POA: Insufficient documentation

## 2011-01-15 DIAGNOSIS — I70219 Atherosclerosis of native arteries of extremities with intermittent claudication, unspecified extremity: Secondary | ICD-10-CM | POA: Insufficient documentation

## 2011-01-15 DIAGNOSIS — Z8673 Personal history of transient ischemic attack (TIA), and cerebral infarction without residual deficits: Secondary | ICD-10-CM | POA: Insufficient documentation

## 2011-01-15 DIAGNOSIS — E785 Hyperlipidemia, unspecified: Secondary | ICD-10-CM | POA: Insufficient documentation

## 2011-01-15 LAB — BASIC METABOLIC PANEL
BUN: 23 mg/dL (ref 6–23)
Calcium: 9.6 mg/dL (ref 8.4–10.5)
Chloride: 104 mEq/L (ref 96–112)
Creatinine, Ser: 1.01 mg/dL (ref 0.50–1.10)
GFR calc Af Amer: >60 mL/min (ref >60)

## 2011-01-15 LAB — CBC
HCT: 39.8 % (ref 36.0–46.0)
MCHC: 34.2 g/dL (ref 30.0–36.0)
Platelets: 288 10*3/uL (ref 150–400)
RDW: 13.4 % (ref 11.5–15.5)

## 2011-01-15 LAB — PROTIME-INR: INR: 0.95 (ref 0.00–1.49)

## 2011-01-16 LAB — BASIC METABOLIC PANEL
BUN: 22 mg/dL (ref 6–23)
CO2: 25 mEq/L (ref 19–32)
Chloride: 108 mEq/L (ref 96–112)
Creatinine, Ser: 1.12 mg/dL — ABNORMAL HIGH (ref 0.50–1.10)

## 2011-01-16 LAB — CBC
HCT: 36.1 % (ref 36.0–46.0)
MCV: 87.4 fL (ref 78.0–100.0)
RBC: 4.13 MIL/uL (ref 3.87–5.11)
RDW: 13.8 % (ref 11.5–15.5)
WBC: 15.3 10*3/uL — ABNORMAL HIGH (ref 4.0–10.5)

## 2011-02-08 LAB — BASIC METABOLIC PANEL
BUN: 17
CO2: 25
CO2: 26
Calcium: 8 — ABNORMAL LOW
Calcium: 8.2 — ABNORMAL LOW
Calcium: 8.6
Chloride: 102
Creatinine, Ser: 1.51 — ABNORMAL HIGH
GFR calc Af Amer: 36 — ABNORMAL LOW
GFR calc non Af Amer: 30 — ABNORMAL LOW
GFR calc non Af Amer: 34 — ABNORMAL LOW
Glucose, Bld: 149 — ABNORMAL HIGH
Glucose, Bld: 156 — ABNORMAL HIGH
Glucose, Bld: 163 — ABNORMAL HIGH
Potassium: 4.5
Sodium: 134 — ABNORMAL LOW
Sodium: 135
Sodium: 137

## 2011-02-08 LAB — CBC
HCT: 37.8
HCT: 39.6
Hemoglobin: 13
Hemoglobin: 13.6
MCHC: 33.7
MCHC: 34.3
MCV: 89.9
Platelets: 242
RBC: 4.21
RDW: 12.9
RDW: 13.2
RDW: 13.2
WBC: 9.8

## 2011-02-17 NOTE — Discharge Summary (Signed)
NAMERAYAN, DYAL NO.:  [id number]  MEDICAL RECORD NO.:  [id number]  LOCATION:  6529                         FACILITY:  [facility]  PHYSICIAN:  Nanetta Batty, M.D.   DATE OF BIRTH:  May 05, 1929  DATE OF ADMISSION:  01/15/2011 DATE OF DISCHARGE:  01/16/2011                              DISCHARGE SUMMARY   DISCHARGE DIAGNOSES: 1. Claudication. 2. Peripheral vascular disease, right superficial femoral artery     percutaneous transluminal angioplasty and stenting this admission     with a history of previous left superficial femoral artery     percutaneous transluminal angioplasty and stenting in 2009 and     moderate internal carotid artery disease. 3. History of IV contrast reaction during her December 26, 2010,     diagnostic pulmonary vein angiogram.  The patient was premedicated     for this admission's angiogram and tolerated it well. 4. History of coronary disease with history of multiple percutaneous     coronary interventions and stents in the past.  Her last     catheterization was in August 2008 by Dr. Clarene Duke and there was no     critical disease.  The circumflex had previous stents and these     were patent.  The terminal portion of the left anterior descending     was totalled.  It was too small for any type of intervention.  The     RCA had a stent that was patent and a 50% narrowing distal to the     stent.  She has been treated medically. 5. History of hypertension, she had has a history of ACE inhibitor     intolerance. 6. Dyslipidemia with a history of statin intolerance. 7. History of PLAVIX allergy which caused her itching and a rash in     the past.  HOSPITAL COURSE:  Ms. Theard is a 77 year old female followed by Dr. Allyson Sabal with a history of coronary disease and vascular disease as noted above.  She has had claudication and abnormal ABIs in the right.  She was admitted earlier in August for PV angiogram.  During that procedure, she had a  reaction to the contrast with rash and itching, chest pain, and hypertension.  Intervention was aborted.  She was admitted overnight to the CCU and enzymes were cycled and she ruled out for an MI.  She was brought in again electively, January 15, 2011 and premedicated with Solu- Medrol and Pepcid.  She tolerated this admission's procedure without problems.  PV angiogram revealed a 70-90% mid right SFA stenosis that was dilated and stented with good results.  We feel the patient can be discharged later on January 16, 2011.  She will follow up with Dr. Allyson Sabal after outpatient arterial Dopplers.  DISCHARGE LABORATORY DATA:  White count 15.3, hemoglobin 12.2, hematocrit 36.1, and platelets 281.  Sodium 140, potassium 4.8, BUN 22, and creatinine 1.12.  Telemetry shows sinus rhythm without acute changes.  DISPOSITION:  The patient discharged in stable condition and follow up with Dr. Allyson Sabal, see med rec for complete details.  I should note that in regard to Ms. Fielder's coronary disease, she has had a Myoview on July31, 2012 that  was low-risk.  She has good LV function.     Abelino Derrick, P.A.   ______________________________ Nanetta Batty, M.D.    Lenard Lance  D:  01/16/2011  T:  01/16/2011  Job:  [id number]  Electronically Signed by Corine Shelter P.A. on 01/16/2011 10:45:43 AM Electronically Signed by Nanetta Batty M.D. on 02/17/2011 11:41:53 AM

## 2011-02-17 NOTE — Procedures (Signed)
NAMELAVANDA, [name] NO.:  [id number]  MEDICAL RECORD NO.:  [id number]  LOCATION:  6529                         FACILITY:  [facility]  PHYSICIAN:  Nanetta Batty, M.D.   DATE OF BIRTH:  08/18/1928  DATE OF PROCEDURE: DATE OF DISCHARGE:                   PERIPHERAL VASCULAR INVASIVE PROCEDURE   Right lower extremity angiogram, Diamondback orbital rotational atherectomy, PTA and stent.  [name] is a very pleasant 77 year old mild-to-moderately overweight divorced  African American female, mother of 4, grandmother of 8 grandchildren, who has a history of CAD and PVD.  She has a history of CVA and multiple interventions in the proximal and mid circumflex as well as distal right coronary artery in the past performed by Dr. Julieanne Manson most recently on January 21, 2007.  Problems include PVD status post stenting of mid and distal left SFA with __________ Dopplers.  She has hypertension, hyperlipidemia.  Dopplers in the office revealed a decrease in the right ABI from 0.88 to 0.76 with high-frequency signal. She was recently angiogramed, revealing calcified tandem high-grade lesions in her mid right SFA.  Unfortunately, she developed a contrast reaction which caused [place] to abort the procedure.  She was premedicated at this time with prednisone, Pepcid, Benadryl, and Solu-Medrol; presents now for [facility] orbital rotational atherectomy, PTA and stenting of the right SFA for lifestyle-limiting claudication.  PROCEDURE DESCRIPTION:  The patient was brought to Second Floor [place] [facility] in the postabsorptive state.  She was premedicated with p.o. prednisone as an outpatient, IV Solu-Medrol, Benadryl, and Pepcid for contrast allergy prophylaxis.  She received Valium as well as IV Versed and fentanyl.  Her left groin was prepped and shaved in usual sterile fashion.  Xylocaine 1% was used for local anesthesia.  A 7-French Destination sheath was  inserted into the left femoral artery using standard Seldinger technique of an 0.35 Versacore wire.  Contralateral access was obtained with a crossover catheter, end- hole and Rosen wire.  The patient received 5000 units of heparin intravenously aiming ACT of 204.  Total contrast used during the case was 82 mL.  The lesion was crossed with a 300-cm length 0.14 __________ wire and an 0.18 Quick-cross.  This was then used to exchange for 0.14 Viper wire placed in the abductor canal.  Atherectomy was performed with a 2.0 Stealth bur at 60, 90, and 120,000 PMs.  Frequent use of intra- arterial nitroglycerin was administered.  Following this, the lesion was dilated with 4 x 4 balloon, stented with a 6 x 80 Smart nitinol self- expanding stent and postdilated with a 5 x 6 balloon, resulting in reduction of a 90% and 70% calcified mid right SFA stenosis to 0% residual.  The infrapopliteal anatomy would remain unchanged with two- vessel runoff.  The anterior tibial was occluded and the tibial peroneal trunk had a 75% lesion.  Following this, the sheath was then withdrawn across the bifurcation.  The patient the left lab in stable condition.  Sheaths will be removed once the ACT falls below 170.  The patient will continue to be treated with aspirin.  She is PLAVIX allergic.  She will be hydrated overnight, discharged home in the morning.  She will obtain followup Dopplers in our  office and I will see her back in followup.     Nanetta Batty, M.D.     [name]  D:  01/15/2011  T:  01/15/2011  Job:  [id number]  cc:   [facility] Dr. Wyatt Haste  Electronically Signed by Nanetta Batty M.D. on 02/17/2011 11:41:50 AM

## 2011-02-28 LAB — LIPID PANEL
Cholesterol: 192
HDL: 37 — ABNORMAL LOW
LDL Cholesterol: 134 — ABNORMAL HIGH
Total CHOL/HDL Ratio: 5.2
Triglycerides: 104
VLDL: 21

## 2011-02-28 LAB — CARDIAC PANEL(CRET KIN+CKTOT+MB+TROPI)
CK, MB: 3.8
Total CK: 96
Troponin I: 0.03

## 2011-02-28 LAB — CBC
HCT: 36.4
Hemoglobin: 12.4
MCV: 89.4
WBC: 6.3

## 2011-02-28 LAB — APTT: aPTT: 29

## 2011-02-28 LAB — BASIC METABOLIC PANEL
BUN: 13
Chloride: 111
GFR calc non Af Amer: 55 — ABNORMAL LOW
Glucose, Bld: 180 — ABNORMAL HIGH
Potassium: 3.8
Sodium: 141

## 2011-02-28 LAB — MAGNESIUM: Magnesium: 1.9

## 2012-11-30 ENCOUNTER — Telehealth: Payer: Self-pay | Admitting: Cardiovascular Disease

## 2012-11-30 NOTE — Telephone Encounter (Signed)
Wants to know if she is suppose to have a doppler test this year? Had her last one July of 2013!

## 2012-11-30 NOTE — Telephone Encounter (Signed)
Returned call.  Pt informed doppler result indicated it should be repeated in 12 months and order will be placed.  Pt informed she will be contacted by scheduler to set up appt.  Pt verbalized understanding and agreed w/ plan.

## 2012-12-02 ENCOUNTER — Telehealth: Payer: Self-pay | Admitting: Cardiovascular Disease

## 2012-12-02 NOTE — Telephone Encounter (Signed)
Called pt to schedule doppler left message

## 2012-12-16 ENCOUNTER — Ambulatory Visit (HOSPITAL_COMMUNITY)
Admission: RE | Admit: 2012-12-16 | Discharge: 2012-12-16 | Disposition: A | Discharge status: Home / Self Care | Payer: Medicare Other | Source: Ambulatory Visit | Attending: Cardiovascular Disease | Admitting: Cardiovascular Disease

## 2012-12-16 DIAGNOSIS — I6529 Occlusion and stenosis of unspecified carotid artery: Secondary | ICD-10-CM | POA: Insufficient documentation

## 2012-12-16 NOTE — Progress Notes (Signed)
Carotid Duplex Completed. Cardelia Sassano, RDMS, RVT  

## 2012-12-18 ENCOUNTER — Encounter: Payer: Self-pay | Admitting: *Deleted

## 2012-12-18 ENCOUNTER — Telehealth: Payer: Self-pay | Admitting: *Deleted

## 2012-12-18 DIAGNOSIS — I6529 Occlusion and stenosis of unspecified carotid artery: Secondary | ICD-10-CM

## 2012-12-18 NOTE — Telephone Encounter (Signed)
Message copied by Marella Bile on Fri Dec 18, 2012  4:30 PM ------      Message from: Runell Gess      Created: Fri Dec 18, 2012 12:06 PM       No change from prior study. Repeat in 12 months. ------

## 2013-01-06 ENCOUNTER — Ambulatory Visit (HOSPITAL_COMMUNITY)
Admission: RE | Admit: 2013-01-06 | Discharge: 2013-01-06 | Disposition: A | Discharge status: Home / Self Care | Payer: Medicare Other | Source: Ambulatory Visit | Attending: Cardiovascular Disease | Admitting: Cardiovascular Disease

## 2013-01-06 ENCOUNTER — Ambulatory Visit (INDEPENDENT_AMBULATORY_CARE_PROVIDER_SITE_OTHER): Payer: Medicare Other | Admitting: Cardiovascular Disease

## 2013-01-06 ENCOUNTER — Other Ambulatory Visit (HOSPITAL_COMMUNITY): Payer: Self-pay | Admitting: Cardiovascular Disease

## 2013-01-06 ENCOUNTER — Encounter: Payer: Self-pay | Admitting: Cardiovascular Disease

## 2013-01-06 VITALS — BP 152/92 | HR 65 | Ht 60.0 in | Wt 171.0 lb

## 2013-01-06 DIAGNOSIS — I739 Peripheral vascular disease, unspecified: Secondary | ICD-10-CM | POA: Insufficient documentation

## 2013-01-06 DIAGNOSIS — E785 Hyperlipidemia, unspecified: Secondary | ICD-10-CM

## 2013-01-06 DIAGNOSIS — Z79899 Other long term (current) drug therapy: Secondary | ICD-10-CM

## 2013-01-06 DIAGNOSIS — I2510 Atherosclerotic heart disease of native coronary artery without angina pectoris: Secondary | ICD-10-CM | POA: Insufficient documentation

## 2013-01-06 DIAGNOSIS — I10 Essential (primary) hypertension: Secondary | ICD-10-CM

## 2013-01-06 DIAGNOSIS — I70219 Atherosclerosis of native arteries of extremities with intermittent claudication, unspecified extremity: Secondary | ICD-10-CM

## 2013-01-06 DIAGNOSIS — I779 Disorder of arteries and arterioles, unspecified: Secondary | ICD-10-CM | POA: Insufficient documentation

## 2013-01-06 NOTE — Assessment & Plan Note (Signed)
Well-controlled on current medications 

## 2013-01-06 NOTE — Assessment & Plan Note (Signed)
Asymptomatic, followed by duplex ultrasound with moderate right and mild left internal carotid artery stenosis on aspirin

## 2013-01-06 NOTE — Assessment & Plan Note (Signed)
Status post right SFA PTA and stenting by myself 01/15/11 with a 75% tibioperoneal trunk stenosis and an occluded anterior tibial. She denies claudication. She did have largely Dopplers performed today.

## 2013-01-06 NOTE — Progress Notes (Signed)
01/06/2013 [name]   Aug 26, 1928  [id number]  Primary Physician No primary provider on file. Primary Cardiologist: Runell Gess MD Roseanne Reno   HPI:  The patient is an 79 year old mildly to moderately overweight divorced African American female, mother of 4 and grandmother to 8 grandchildren, whom I last saw 6 months ago. She has a history of CAD with multiple interventions in her circumflex as well as her distal right coronary artery in the past. She was last catheterized by Dr. Clarene Duke February 17, 2007, revealing minimal progression of her disease.   Her other problems include PVOD with stenting of her left mid and distal SFA with improvement in her claudication as well as her Dopplers. I performed Diamondback orbital rotational atherectomy, PTA and stenting of her right SFA using a S.M.A.R.T. nitinol self-expanding stent January 07, 2011, which resulted in improvement in her Dopplers and claudication. She is otherwise currently asymptomatic. Her other problems include hypertension, hyperlipidemia, and moderate right internal carotid artery stenosis which we are following by duplex ultrasound and which has remained stable. She is neurologically asymptomatic. She is stain intolerant.   Her last lipid profile in our chart was from a year ago revealing a total cholesterol of 275, LDL of 193, and HDL of 59.since I saw her 6 months ago she has been completely asymptomatic denying chest pain, shortness of breath, or claudication.      Current Outpatient Prescriptions  Medication Sig Dispense Refill  . amLODipine (NORVASC) 5 MG tablet Take 5 mg by mouth daily.      Marland Kitchen aspirin 325 MG tablet Take 325 mg by mouth daily.      . cholecalciferol (VITAMIN D) 400 UNITS TABS tablet Take 400 Units by mouth daily.      . fish oil-omega-3 fatty acids 1000 MG capsule Take 2 g by mouth daily.      Marland Kitchen losartan (COZAAR) 50 MG tablet Take 50 mg by mouth daily.      . metoprolol succinate  (TOPROL-XL) 50 MG 24 hr tablet Take 50 mg by mouth daily.      . timolol (TIMOPTIC) 0.5 % ophthalmic solution Apply 1 drop to eye 2 (two) times daily.      . traMADol (ULTRAM) 50 MG tablet Take 50 mg by mouth daily.       No current facility-administered medications for this visit.    No Known Allergies  History   Social History  . Marital Status: Divorced    Spouse Name: N/A    Number of Children: N/A  . Years of Education: N/A   Occupational History  . Not on file.   Social History Main Topics  . Smoking status: Never Smoker   . Smokeless tobacco: Not on file  . Alcohol Use: No  . Drug Use: Not on file  . Sexual Activity: Not on file   Other Topics Concern  . Not on file   Social History Narrative  . No narrative on file     Review of Systems: General: negative for chills, fever, night sweats or weight changes.  Cardiovascular: negative for chest pain, dyspnea on exertion, edema, orthopnea, palpitations, paroxysmal nocturnal dyspnea or shortness of breath Dermatological: negative for rash Respiratory: negative for cough or wheezing Urologic: negative for hematuria Abdominal: negative for nausea, vomiting, diarrhea, bright red blood per rectum, melena, or hematemesis Neurologic: negative for visual changes, syncope, or dizziness All other systems reviewed and are otherwise negative except as noted above.    Blood  pressure 152/92, pulse 65, height 5' (1.524 m), weight 171 lb (77.565 kg).  General appearance: alert and no distress Neck: no adenopathy, no JVD, supple, symmetrical, trachea midline, thyroid not enlarged, symmetric, no tenderness/mass/nodules and soft bilateral carotid bruits Lungs: clear to auscultation bilaterally Heart: regular rate and rhythm, S1, S2 normal, no murmur, click, rub or gallop Extremities: extremities normal, atraumatic, no cyanosis or edema  EKG normal sinus rhythm at 65 without ST or T wave changes  ASSESSMENT AND PLAN:    Coronary artery disease Patient had multiple interventions in her circumflex and distal right renal artery in the past. Her last catheterization performed Dr. Clarene Duke 02/17/07 revealed minimal progression of her disease. Her last Myoview performed 12/18/10 was normal. She denies chest pain or shortness of breath.  Carotid artery disease Asymptomatic, followed by duplex ultrasound with moderate right and mild left internal carotid artery stenosis on aspirin  Peripheral arterial disease Status post right SFA PTA and stenting by myself 01/15/11 with a 75% tibioperoneal trunk stenosis and an occluded anterior tibial. She denies claudication. She did have largely Dopplers performed today.  Essential hypertension Well-controlled on current medications  Hyperlipidemia On statin therapy. We will check a fasting lipid profile      Runell Gess MD [place], [facility] 01/06/2013 12:38 PM

## 2013-01-06 NOTE — Progress Notes (Signed)
Lower Extremity Arterial Duplex Completed. °Brianna L Mazza,RVT °

## 2013-01-06 NOTE — Assessment & Plan Note (Signed)
On statin therapy. We will check a fasting lipid profile

## 2013-01-06 NOTE — Assessment & Plan Note (Signed)
Patient had multiple interventions in her circumflex and distal right renal artery in the past. Her last catheterization performed Dr. Clarene Duke 02/17/07 revealed minimal progression of her disease. Her last Myoview performed 12/18/10 was normal. She denies chest pain or shortness of breath.

## 2013-01-06 NOTE — Patient Instructions (Addendum)
  We will see you back in follow up in 6 months  Dr Berry has ordered blood work to be done fasting   

## 2013-01-12 ENCOUNTER — Telehealth: Payer: Self-pay | Admitting: Cardiovascular Disease

## 2013-01-12 DIAGNOSIS — E782 Mixed hyperlipidemia: Secondary | ICD-10-CM

## 2013-01-12 DIAGNOSIS — Z79899 Other long term (current) drug therapy: Secondary | ICD-10-CM

## 2013-01-12 NOTE — Telephone Encounter (Signed)
Returned call.  Pt informed labs will be reordered for [facility].  Advised she always inform the nurse as labs default to [place].  Pt verbalized understanding and agreed w/ plan.

## 2013-01-12 NOTE — Telephone Encounter (Signed)
Said she could not get her lab work at [facility] was for [facility] or [facility].Please fax a [facility] order today if possible.Fax # is [phone].

## 2013-01-14 ENCOUNTER — Telehealth: Payer: Self-pay | Admitting: *Deleted

## 2013-01-14 DIAGNOSIS — I739 Peripheral vascular disease, unspecified: Secondary | ICD-10-CM

## 2013-01-14 LAB — LIPID PANEL: Chol/HDL Ratio: 4.4 ratio units (ref 0.0–4.4)

## 2013-01-14 LAB — HEPATIC FUNCTION PANEL
AST: 15 IU/L (ref 0–40)
Albumin: 4.2 g/dL (ref 3.5–4.7)
Alkaline Phosphatase: 108 IU/L (ref 39–117)
Bilirubin, Direct: 0.1 mg/dL (ref 0.00–0.40)
Total Bilirubin: 0.4 mg/dL (ref 0.0–1.2)
Total Protein: 6.8 g/dL (ref 6.0–8.5)

## 2013-01-14 NOTE — Telephone Encounter (Signed)
Message copied by Marella Bile on Thu Jan 14, 2013  2:23 PM ------      Message from: Runell Gess      Created: Wed Jan 13, 2013  1:14 PM       No change from prior study. Repeat in 12 months. ------

## 2013-01-16 IMAGING — CR DG CHEST 2V
2 series · 2 of 2 positions shown · non-contrast
Comparison: June 10, 2007

CLINICAL DATA: Preoperative respiratory exam; coronary artery
disease; cough

CHEST - 2 VIEW

[w chest pa]
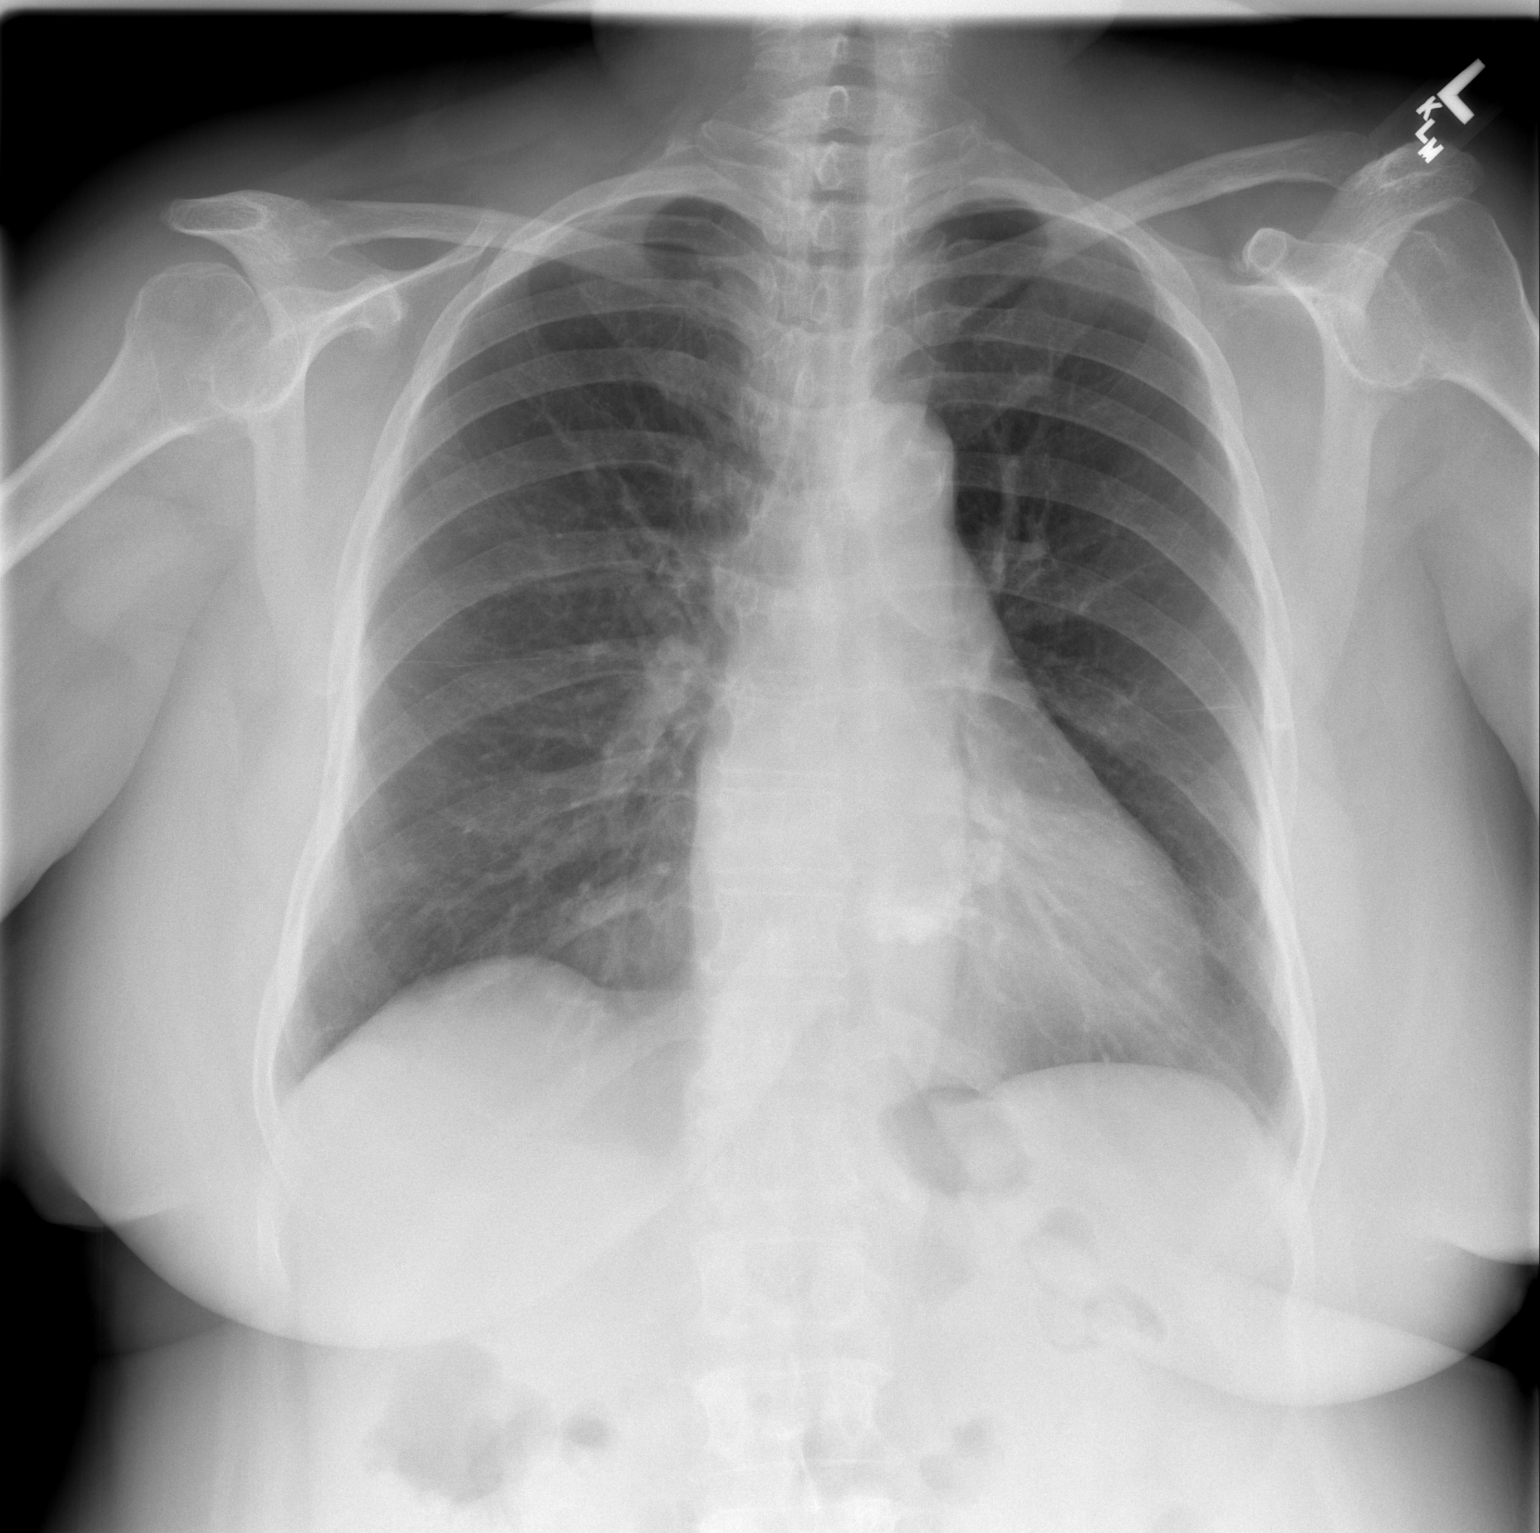

[w chest lat]
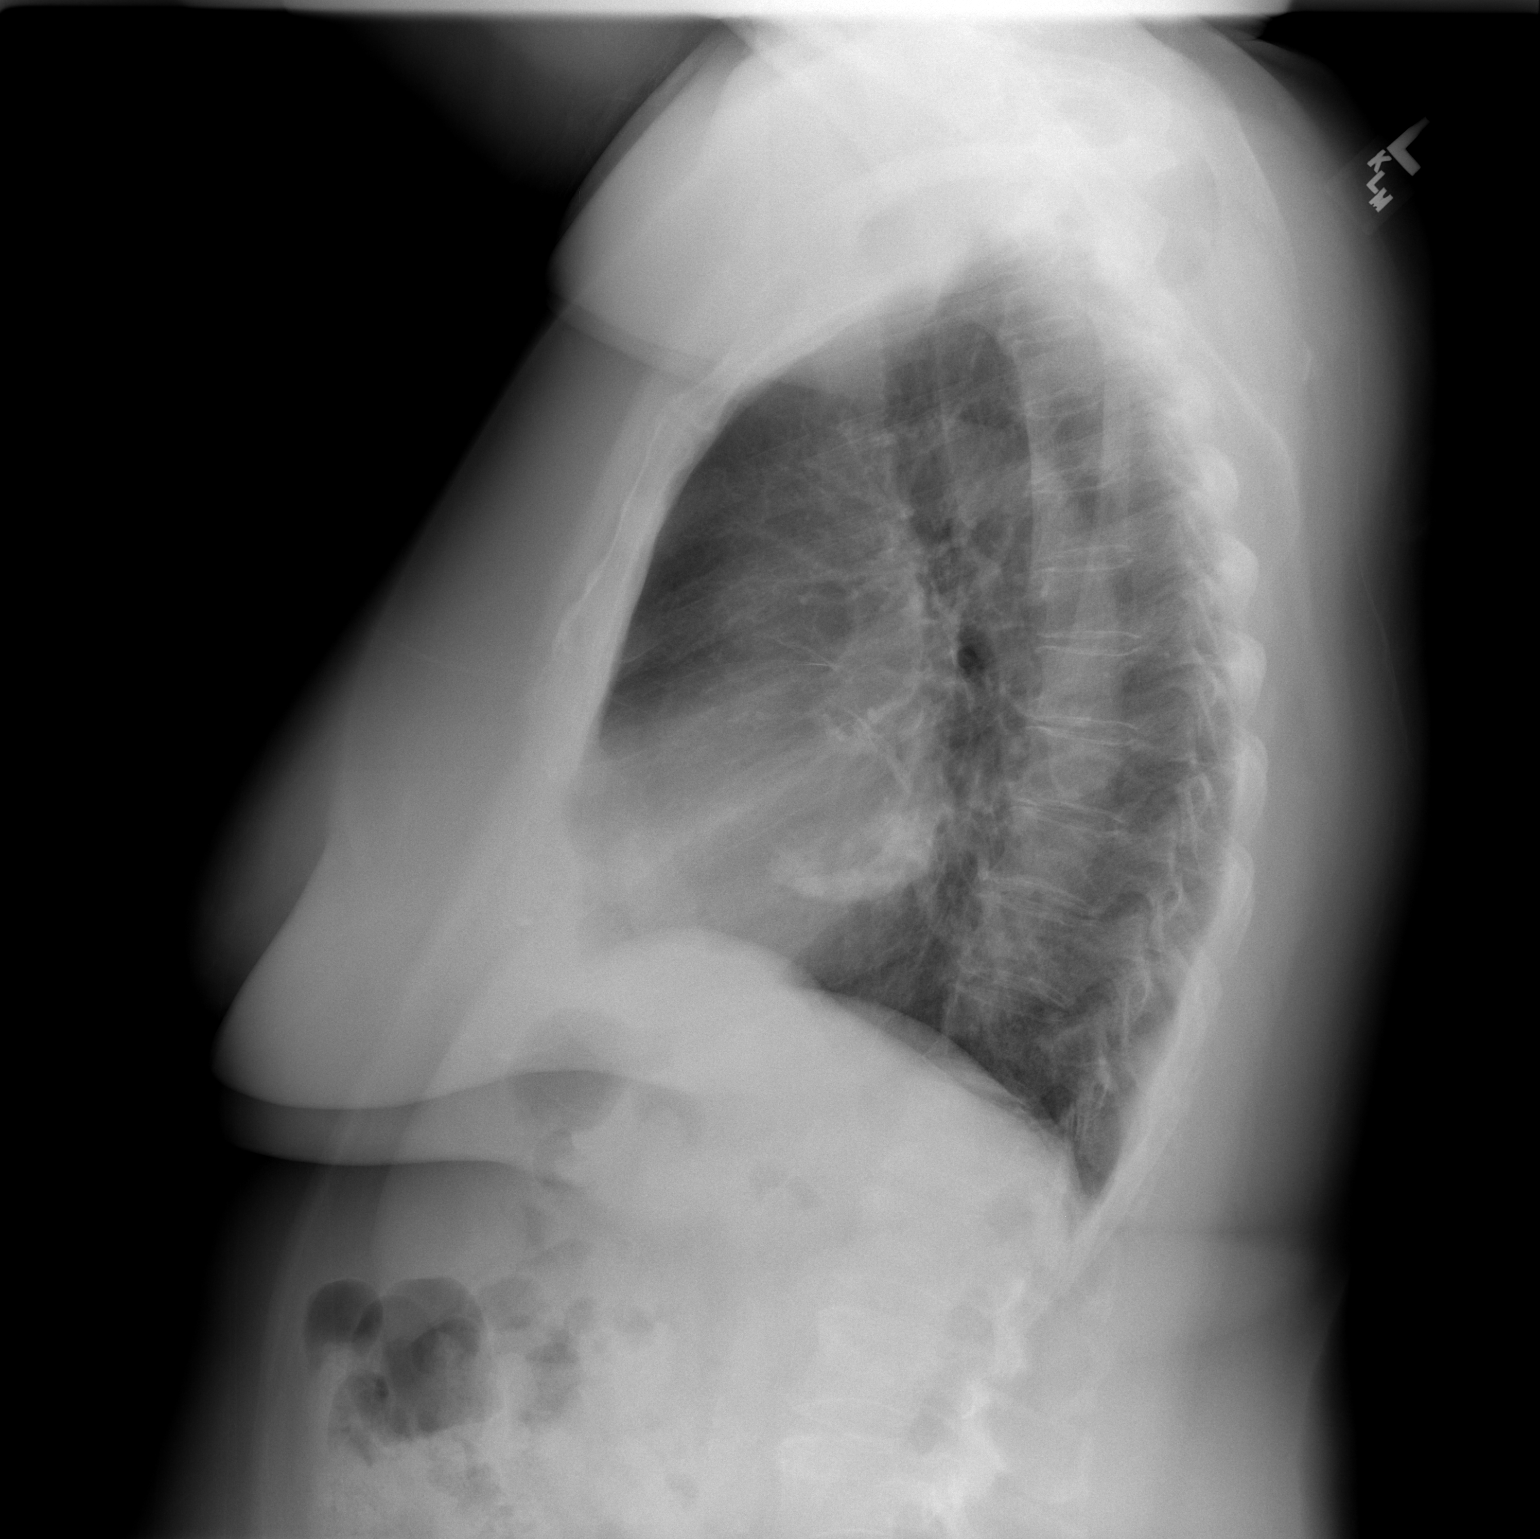

[2 of 2 positions shown; findings below may reference images not displayed]

FINDINGS: The cardiac silhouette, mediastinum, pulmonary
vasculature are within normal limits.  Mitral annular
calcifications are again noted.  Both lungs are clear.   There is
no acute bony abnormality.
IMPRESSION: There is no evidence of acute cardiac or pulmonary process.

## 2013-08-03 ENCOUNTER — Ambulatory Visit: Payer: Medicare Other | Admitting: Cardiovascular Disease

## 2013-12-22 ENCOUNTER — Ambulatory Visit (HOSPITAL_COMMUNITY)
Admission: RE | Admit: 2013-12-22 | Discharge: 2013-12-22 | Disposition: A | Discharge status: Home / Self Care | Payer: Medicare Other | Source: Ambulatory Visit | Attending: Cardiology | Admitting: Cardiology

## 2013-12-22 ENCOUNTER — Ambulatory Visit (HOSPITAL_BASED_OUTPATIENT_CLINIC_OR_DEPARTMENT_OTHER)
Admission: RE | Admit: 2013-12-22 | Discharge: 2013-12-22 | Disposition: A | Discharge status: Home / Self Care | Payer: Medicare Other | Source: Ambulatory Visit | Attending: Cardiovascular Disease | Admitting: Cardiovascular Disease

## 2013-12-22 DIAGNOSIS — I739 Peripheral vascular disease, unspecified: Secondary | ICD-10-CM | POA: Diagnosis present

## 2013-12-22 DIAGNOSIS — I70219 Atherosclerosis of native arteries of extremities with intermittent claudication, unspecified extremity: Secondary | ICD-10-CM

## 2013-12-22 DIAGNOSIS — I6529 Occlusion and stenosis of unspecified carotid artery: Secondary | ICD-10-CM

## 2013-12-22 NOTE — Progress Notes (Signed)
Carotid Duplex Completed. Tija Biss, BS, RDMS, RVT  

## 2013-12-22 NOTE — Progress Notes (Signed)
Arterial Duplex Lower Ext. Completed. Josue Kass, BS, RDMS, RVT  

## 2013-12-29 ENCOUNTER — Encounter: Payer: Self-pay | Admitting: *Deleted

## 2014-02-16 ENCOUNTER — Ambulatory Visit (INDEPENDENT_AMBULATORY_CARE_PROVIDER_SITE_OTHER): Payer: Medicare Other | Admitting: Cardiovascular Disease

## 2014-02-16 ENCOUNTER — Encounter: Payer: Self-pay | Admitting: Cardiovascular Disease

## 2014-02-16 VITALS — BP 182/94 | HR 67 | Ht 59.0 in | Wt 175.2 lb

## 2014-02-16 DIAGNOSIS — I2510 Atherosclerotic heart disease of native coronary artery without angina pectoris: Secondary | ICD-10-CM

## 2014-02-16 DIAGNOSIS — I6529 Occlusion and stenosis of unspecified carotid artery: Secondary | ICD-10-CM

## 2014-02-16 DIAGNOSIS — I10 Essential (primary) hypertension: Secondary | ICD-10-CM

## 2014-02-16 MED ORDER — LOSARTAN POTASSIUM-HCTZ 100-25 MG PO TABS: 1.0000 | ORAL_TABLET | Freq: Every day | ORAL | Status: AC

## 2014-02-16 MED ORDER — LOSARTAN POTASSIUM-HCTZ 100-25 MG PO TABS: 1.0000 | ORAL_TABLET | Freq: Every day | ORAL | Status: DC

## 2014-02-16 NOTE — Assessment & Plan Note (Signed)
History of CAD status post multiple interventions on her circumflex and distal RCA in the past. Her last catheterization performed by Dr. Clarene DukeLittle 02/17/07 revealed minimal progression of her disease. She denies chest pain or shortness of breath.

## 2014-02-16 NOTE — Patient Instructions (Addendum)
Follow up with Belenda CruiseKristin, the pharmacist, one 1 month to check your blood pressure and do medication adjustment.   Your physician wants you to follow-up in: 1 year with Dr Allyson SabalBerry. You will receive a reminder letter in the mail two months in advance. If you don't receive a letter, please call our office to schedule the follow-up appointment.  Just give me a call if you want to proceed with an angiogram. ([phone])  Increase losartan hct to 100/25mg  one tablet daily. Please keep a blood pressure log at home and bring that with you to your appointment with Belenda CruiseKristin.

## 2014-02-16 NOTE — Assessment & Plan Note (Signed)
History of peripheral arterial disease status post stenting of her mid and distal left SFA in the past. I performed diamondback orbital or tissue atherectomy, PTA and stenting of the right SFA using a Smart diagnosed with expanding stent 01/07/11 resulting improvement in her symptoms and Dopplers. She has developed left bowel limiting claudication in her right calf. Recent Dopplers performed 12/22/13 revealed progression of disease the right popliteal artery with decrease in her right ABI of 0.89. We talked about readjusting intervention which she will consider.

## 2014-02-16 NOTE — Assessment & Plan Note (Signed)
Moderate right internal carotid artery stenosis which has remained stable. She is neurologically asymptomatic. We followed this by duplex ultrasound.

## 2014-02-16 NOTE — Assessment & Plan Note (Signed)
She is statin intolerant. We will discuss the option of monoclonal injectables.

## 2014-02-16 NOTE — Assessment & Plan Note (Signed)
Her blood pressure is elevated today. I am going to increase the losartan from 50-100 mg a day and will have her check daily blood pressures and keep a log. She will see Belenda CruiseKristin back in the office 1 month for review and

## 2014-02-16 NOTE — Progress Notes (Addendum)
   02/16/2014 [name]   04/05/1929  [id number]  Primary Physician No PCP Per Patient Primary Cardiologist: Jaydi Bray J. Lorrayne Ismael MD FACP,FACC,FAHA, FSCAI   HPI:  The patient is an 80-year-old mildly to moderately overweight divorced African American female, mother of 4 and grandmother to 8 grandchildren, whom I last saw 12 months ago. She has a history of CAD with multiple interventions in her circumflex as well as her distal right coronary artery in the past. She was last catheterized by Dr. Little February 17, 2007, revealing minimal progression of her disease.   Her other problems include PVOD with stenting of her left mid and distal SFA with improvement in her claudication as well as her Dopplers. I performed Diamondback orbital rotational atherectomy, PTA and stenting of her right SFA using a S.M.A.R.T. nitinol self-expanding stent January 07, 2011, which resulted in improvement in her Dopplers and claudication. She is otherwise currently asymptomatic. Her other problems include hypertension, hyperlipidemia, and moderate right internal carotid artery stenosis which we are following by duplex ultrasound and which has remained stable. She is neurologically asymptomatic. She is stain intolerant. We discussed the possibility of monoclonal injectables such as PCS K9.  And I saw her a year ago she's noticed increasing right calf claudication with Dopplers to show progression of disease in her right popliteal artery. She denies chest pain or shortness of breath     Current Outpatient Prescriptions  Medication Sig Dispense Refill  . amLODipine (NORVASC) 5 MG tablet Take 5 mg by mouth daily.      . aspirin 325 MG tablet Take 325 mg by mouth daily.      . dorzolamide-timolol (COSOPT) 22.3-6.8 MG/ML ophthalmic solution Place 1 drop into both eyes 2 (two) times daily.      . fish oil-omega-3 fatty acids 1000 MG capsule Take 2 g by mouth daily.      . metoprolol succinate (TOPROL-XL) 50 MG 24  hr tablet Take 50 mg by mouth daily.      . Omega-3 Fatty Acids (FISH OIL) 1000 MG CAPS Take 2 g by mouth.      . timolol (TIMOPTIC) 0.5 % ophthalmic solution Apply 1 drop to eye 2 (two) times daily.      . traMADol (ULTRAM) 50 MG tablet Take 50 mg by mouth daily.       No current facility-administered medications for this visit.    No Known Allergies  History   Social History  . Marital Status: Divorced    Spouse Name: N/A    Number of Children: N/A  . Years of Education: N/A   Occupational History  . Not on file.   Social History Main Topics  . Smoking status: Never Smoker   . Smokeless tobacco: Not on file  . Alcohol Use: No  . Drug Use: Not on file  . Sexual Activity: Not on file   Other Topics Concern  . Not on file   Social History Narrative  . No narrative on file     Review of Systems: General: negative for chills, fever, night sweats or weight changes.  Cardiovascular: negative for chest pain, dyspnea on exertion, edema, orthopnea, palpitations, paroxysmal nocturnal dyspnea or shortness of breath Dermatological: negative for rash Respiratory: negative for cough or wheezing Urologic: negative for hematuria Abdominal: negative for nausea, vomiting, diarrhea, bright red blood per rectum, melena, or hematemesis Neurologic: negative for visual changes, syncope, or dizziness All other systems reviewed and are otherwise negative except as noted above.      Blood pressure 182/94, pulse 67, height 4\' 11"  (1.499 m), weight 175 lb 3.2 oz (79.47 kg).  General appearance: alert and no distress Neck: no adenopathy, no JVD, supple, symmetrical, trachea midline, thyroid not enlarged, symmetric, no tenderness/mass/nodules and soft right carotid bruit Lungs: clear to auscultation bilaterally Heart: regular rate and rhythm, S1, S2 normal, no murmur, click, rub or gallop Extremities: extremities normal, atraumatic, no cyanosis or edema  EKG normal sinus rhythm at 67 without  ST or T wave changes  ASSESSMENT AND PLAN:   Coronary artery disease History of CAD status post multiple interventions on her circumflex and distal RCA in the past. Her last catheterization performed by Dr. Clarene DukeLittle 02/17/07 revealed minimal progression of her disease. She denies chest pain or shortness of breath.  Peripheral arterial disease History of peripheral arterial disease status post stenting of her mid and distal left SFA in the past. I performed diamondback orbital or tissue atherectomy, PTA and stenting of the right SFA using a Smart diagnosed with expanding stent 01/07/11 resulting improvement in her symptoms and Dopplers. She has developed left bowel limiting claudication in her right calf. Recent Dopplers performed 12/22/13 revealed progression of disease the right popliteal artery with decrease in her right ABI of 0.89. We talked about readjusting intervention which she will consider.  Essential hypertension Her blood pressure is elevated today. I am going to increase the losartan from 50-100 mg a day and will have her check daily blood pressures and keep a log. She will see Belenda CruiseKristin back in the office 1 month for review and  Carotid artery disease Moderate right internal carotid artery stenosis which has remained stable. She is neurologically asymptomatic. We followed this by duplex ultrasound.  Hyperlipidemia She is statin intolerant. We will discuss the option of monoclonal injectables.      Runell GessJonathan J. Jolon Degante MD FACP,FACC,FAHA, FSCAI 02/16/2014 3:00 PM   Addendum: As well as and has decided to proceed with peripheral angiography and endovascular therapy of her right popliteal artery lifestyle limiting claudication. We will schedule this in the near future.

## 2014-02-21 ENCOUNTER — Other Ambulatory Visit: Payer: Self-pay | Admitting: *Deleted

## 2014-02-21 ENCOUNTER — Encounter: Payer: Self-pay | Admitting: Cardiovascular Disease

## 2014-02-21 ENCOUNTER — Telehealth: Payer: Self-pay | Admitting: Cardiovascular Disease

## 2014-02-21 DIAGNOSIS — I739 Peripheral vascular disease, unspecified: Secondary | ICD-10-CM

## 2014-02-21 DIAGNOSIS — D689 Coagulation defect, unspecified: Secondary | ICD-10-CM

## 2014-02-21 DIAGNOSIS — Z79899 Other long term (current) drug therapy: Secondary | ICD-10-CM

## 2014-02-21 DIAGNOSIS — Z01812 Encounter for preprocedural laboratory examination: Secondary | ICD-10-CM

## 2014-02-21 DIAGNOSIS — R5383 Other fatigue: Secondary | ICD-10-CM

## 2014-02-21 NOTE — Telephone Encounter (Signed)
Spoke with pt, she is ready to schedule the procedure on her legs. [facility]

## 2014-02-21 NOTE — Telephone Encounter (Signed)
Please call,she wants to find out about scheduling her procedure.

## 2014-02-21 NOTE — Telephone Encounter (Signed)
I spoke with patient.  I will give all info to Deedie and let her proceed with scheduling.

## 2014-02-28 ENCOUNTER — Encounter (HOSPITAL_COMMUNITY): Payer: Self-pay | Admitting: Pharmacy Technician

## 2014-02-28 ENCOUNTER — Telehealth: Payer: Self-pay | Admitting: Cardiovascular Disease

## 2014-02-28 NOTE — Telephone Encounter (Signed)
Spoke with pt, she would like to do [place] cxr in [place]. Order faxed to [facility] on [address] @ [phone].

## 2014-02-28 NOTE — Telephone Encounter (Signed)
Ms.Robinsonis calling to find out if she needs to have a chest xray before her Angio.Please call   thanks

## 2014-03-01 LAB — BASIC METABOLIC PANEL
BUN / CREAT RATIO: 17 (ref 11–26)
BUN: 18 mg/dL (ref 8–27)
CALCIUM: 9.9 mg/dL (ref 8.7–10.3)
CO2: 25 mmol/L (ref 18–29)
Chloride: 98 mmol/L (ref 97–108)
Creatinine, Ser: 1.04 mg/dL — ABNORMAL HIGH (ref 0.57–1.00)
GFR, EST AFRICAN AMERICAN: 57 mL/min/{1.73_m2} — AB (ref >59)
GFR, EST NON AFRICAN AMERICAN: 50 mL/min/{1.73_m2} — AB (ref >59)
Glucose: 97 mg/dL (ref 65–99)
POTASSIUM: 4.2 mmol/L (ref 3.5–5.2)
Sodium: 139 mmol/L (ref 134–144)

## 2014-03-01 LAB — CBC
HCT: 40.9 % (ref 34.0–46.6)
HEMOGLOBIN: 13.7 g/dL (ref 11.1–15.9)
MCH: 30.2 pg (ref 26.6–33.0)
MCHC: 33.5 g/dL (ref 31.5–35.7)
MCV: 90 fL (ref 79–97)
Platelets: 242 10*3/uL (ref 150–379)
RBC: 4.54 x10E6/uL (ref 3.77–5.28)
RDW: 13.5 % (ref 12.3–15.4)
WBC: 6.5 10*3/uL (ref 3.4–10.8)

## 2014-03-01 LAB — PROTIME-INR
INR: 1 (ref 0.8–1.2)
PROTHROMBIN TIME: 10.6 s (ref 9.1–12.0)

## 2014-03-01 LAB — APTT: APTT: 28 s (ref 24–33)

## 2014-03-01 LAB — TSH: TSH: 1.55 u[IU]/mL (ref 0.450–4.500)

## 2014-03-06 ENCOUNTER — Encounter: Payer: Self-pay | Admitting: *Deleted

## 2014-03-07 ENCOUNTER — Encounter (HOSPITAL_COMMUNITY)
Admission: RE | Disposition: A | Discharge status: Home / Self Care | Payer: Self-pay | Source: Ambulatory Visit | Attending: Cardiovascular Disease

## 2014-03-07 ENCOUNTER — Ambulatory Visit (HOSPITAL_COMMUNITY)
Admission: RE | Admit: 2014-03-07 | Discharge: 2014-03-08 | Disposition: A | Discharge status: Home / Self Care | Payer: Medicare Other | Source: Ambulatory Visit | Attending: Cardiovascular Disease | Admitting: Cardiovascular Disease

## 2014-03-07 ENCOUNTER — Encounter (HOSPITAL_COMMUNITY): Payer: Self-pay | Admitting: General Practice

## 2014-03-07 DIAGNOSIS — I10 Essential (primary) hypertension: Secondary | ICD-10-CM | POA: Diagnosis not present

## 2014-03-07 DIAGNOSIS — I6521 Occlusion and stenosis of right carotid artery: Secondary | ICD-10-CM | POA: Insufficient documentation

## 2014-03-07 DIAGNOSIS — I70211 Atherosclerosis of native arteries of extremities with intermittent claudication, right leg: Secondary | ICD-10-CM | POA: Diagnosis not present

## 2014-03-07 DIAGNOSIS — Z7982 Long term (current) use of aspirin: Secondary | ICD-10-CM | POA: Diagnosis not present

## 2014-03-07 DIAGNOSIS — Y831 Surgical operation with implant of artificial internal device as the cause of abnormal reaction of the patient, or of later complication, without mention of misadventure at the time of the procedure: Secondary | ICD-10-CM | POA: Diagnosis not present

## 2014-03-07 DIAGNOSIS — T82858A Stenosis of vascular prosthetic devices, implants and grafts, initial encounter: Secondary | ICD-10-CM | POA: Insufficient documentation

## 2014-03-07 DIAGNOSIS — I739 Peripheral vascular disease, unspecified: Secondary | ICD-10-CM | POA: Diagnosis present

## 2014-03-07 DIAGNOSIS — E663 Overweight: Secondary | ICD-10-CM | POA: Insufficient documentation

## 2014-03-07 DIAGNOSIS — E785 Hyperlipidemia, unspecified: Secondary | ICD-10-CM | POA: Insufficient documentation

## 2014-03-07 DIAGNOSIS — I2510 Atherosclerotic heart disease of native coronary artery without angina pectoris: Secondary | ICD-10-CM | POA: Insufficient documentation

## 2014-03-07 DIAGNOSIS — Z79899 Other long term (current) drug therapy: Secondary | ICD-10-CM | POA: Diagnosis not present

## 2014-03-07 DIAGNOSIS — Z01812 Encounter for preprocedural laboratory examination: Secondary | ICD-10-CM

## 2014-03-07 DIAGNOSIS — D689 Coagulation defect, unspecified: Secondary | ICD-10-CM

## 2014-03-07 DIAGNOSIS — Z95820 Peripheral vascular angioplasty status with implants and grafts: Secondary | ICD-10-CM | POA: Diagnosis not present

## 2014-03-07 DIAGNOSIS — R5383 Other fatigue: Secondary | ICD-10-CM

## 2014-03-07 HISTORY — PX: LOWER EXTREMITY ANGIOGRAM: SHX5955

## 2014-03-07 HISTORY — DX: Unspecified osteoarthritis, unspecified site: M19.90

## 2014-03-07 HISTORY — PX: ANGIOPLASTY: SHX39

## 2014-03-07 HISTORY — PX: LOWER EXTREMITY ANGIOGRAM: SHX5508

## 2014-03-07 LAB — POCT ACTIVATED CLOTTING TIME
ACTIVATED CLOTTING TIME: 180 s
ACTIVATED CLOTTING TIME: 157 s
Activated Clotting Time: 264 seconds
Activated Clotting Time: 225 seconds

## 2014-03-07 LAB — TROPONIN I
Troponin I: <0.3 ng/mL (ref <0.30)
Troponin I: <0.3 ng/mL (ref <0.30)

## 2014-03-07 SURGERY — ANGIOGRAM, LOWER EXTREMITY
Anesthesia: LOCAL | Laterality: Right

## 2014-03-07 MED ORDER — HYDROCHLOROTHIAZIDE 25 MG PO TABS: 25.0000 mg | ORAL_TABLET | Freq: Every day | ORAL | Status: DC

## 2014-03-07 MED ORDER — GI COCKTAIL ~~LOC~~
30.0000 mL | Freq: Once | ORAL | Status: AC
  Administered 2014-03-07: 15:00:00 30 mL via ORAL
  Filled 2014-03-07: qty 30

## 2014-03-07 MED ORDER — CLOPIDOGREL BISULFATE 75 MG PO TABS
75.0000 mg | ORAL_TABLET | Freq: Every day | ORAL | Status: DC
  Administered 2014-03-08: 09:00:00 75 mg via ORAL
  Filled 2014-03-07: qty 1

## 2014-03-07 MED ORDER — HEPARIN (PORCINE) IN NACL 2-0.9 UNIT/ML-% IJ SOLN
INTRAMUSCULAR | Status: AC
  Filled 2014-03-07: qty 1000

## 2014-03-07 MED ORDER — LIDOCAINE HCL (PF) 1 % IJ SOLN
INTRAMUSCULAR | Status: AC
  Filled 2014-03-07: qty 30

## 2014-03-07 MED ORDER — FENTANYL CITRATE 0.05 MG/ML IJ SOLN
INTRAMUSCULAR | Status: AC
  Filled 2014-03-07: qty 2

## 2014-03-07 MED ORDER — ONDANSETRON HCL 4 MG/2ML IJ SOLN: 4.0000 mg | Freq: Four times a day (QID) | INTRAMUSCULAR | Status: DC | PRN

## 2014-03-07 MED ORDER — ASPIRIN 325 MG PO TABS: 325.0000 mg | ORAL_TABLET | Freq: Every day | ORAL | Status: DC

## 2014-03-07 MED ORDER — FAMOTIDINE IN NACL 20-0.9 MG/50ML-% IV SOLN
20.0000 mg | INTRAVENOUS | Status: AC
  Administered 2014-03-07: 20 mg via INTRAVENOUS

## 2014-03-07 MED ORDER — HYDRALAZINE HCL 20 MG/ML IJ SOLN
10.0000 mg | Freq: Once | INTRAMUSCULAR | Status: AC
  Administered 2014-03-07: 10 mg via INTRAVENOUS

## 2014-03-07 MED ORDER — SODIUM CHLORIDE 0.9 % IV SOLN
INTRAVENOUS | Status: AC
  Administered 2014-03-07: 12:00:00 via INTRAVENOUS

## 2014-03-07 MED ORDER — LOSARTAN POTASSIUM-HCTZ 100-25 MG PO TABS: 1.0000 | ORAL_TABLET | Freq: Every day | ORAL | Status: DC

## 2014-03-07 MED ORDER — SODIUM CHLORIDE 0.9 % IV SOLN
INTRAVENOUS | Status: DC
  Administered 2014-03-07: 1000 mL via INTRAVENOUS

## 2014-03-07 MED ORDER — LOSARTAN POTASSIUM 50 MG PO TABS: 100.0000 mg | ORAL_TABLET | Freq: Every day | ORAL | Status: DC

## 2014-03-07 MED ORDER — MAGNESIUM HYDROXIDE NICU ORAL SYRINGE 400 MG/5 ML: 30.0000 mL | Freq: Every day | ORAL | Status: DC | PRN

## 2014-03-07 MED ORDER — HYDRALAZINE HCL 20 MG/ML IJ SOLN
10.0000 mg | INTRAMUSCULAR | Status: DC | PRN
  Administered 2014-03-07: 10 mg via INTRAVENOUS
  Filled 2014-03-07 (×2): qty 1

## 2014-03-07 MED ORDER — TIMOLOL MALEATE 0.5 % OP SOLN
1.0000 [drp] | Freq: Two times a day (BID) | OPHTHALMIC | Status: DC
  Administered 2014-03-07 – 2014-03-08 (×2): 1 [drp] via OPHTHALMIC
  Filled 2014-03-07: qty 5

## 2014-03-07 MED ORDER — HEPARIN SODIUM (PORCINE) 1000 UNIT/ML IJ SOLN
INTRAMUSCULAR | Status: AC
  Filled 2014-03-07: qty 1

## 2014-03-07 MED ORDER — DIPHENHYDRAMINE HCL 50 MG/ML IJ SOLN
25.0000 mg | INTRAMUSCULAR | Status: AC
  Administered 2014-03-07: 25 mg via INTRAVENOUS

## 2014-03-07 MED ORDER — MORPHINE SULFATE 2 MG/ML IJ SOLN
2.0000 mg | INTRAMUSCULAR | Status: DC | PRN
  Administered 2014-03-07: 2 mg via INTRAVENOUS
  Filled 2014-03-07: qty 1

## 2014-03-07 MED ORDER — METOPROLOL SUCCINATE ER 50 MG PO TB24
50.0000 mg | ORAL_TABLET | Freq: Every day | ORAL | Status: DC
  Administered 2014-03-08: 50 mg via ORAL
  Filled 2014-03-07: qty 1

## 2014-03-07 MED ORDER — FAMOTIDINE IN NACL 20-0.9 MG/50ML-% IV SOLN
INTRAVENOUS | Status: AC
  Administered 2014-03-07: 20 mg via INTRAVENOUS
  Filled 2014-03-07: qty 50

## 2014-03-07 MED ORDER — METOPROLOL SUCCINATE ER 50 MG PO TB24: 50.0000 mg | ORAL_TABLET | Freq: Every day | ORAL | Status: DC

## 2014-03-07 MED ORDER — SODIUM CHLORIDE 0.9 % IV SOLN: Freq: Once | INTRAVENOUS | Status: DC

## 2014-03-07 MED ORDER — SODIUM CHLORIDE 0.9 % IJ SOLN: 3.0000 mL | INTRAMUSCULAR | Status: DC | PRN

## 2014-03-07 MED ORDER — AMLODIPINE BESYLATE 5 MG PO TABS: 5.0000 mg | ORAL_TABLET | Freq: Every day | ORAL | Status: DC

## 2014-03-07 MED ORDER — MIDAZOLAM HCL 2 MG/2ML IJ SOLN
INTRAMUSCULAR | Status: AC
  Filled 2014-03-07: qty 2

## 2014-03-07 MED ORDER — PANTOPRAZOLE SODIUM 40 MG PO TBEC
40.0000 mg | DELAYED_RELEASE_TABLET | Freq: Every day | ORAL | Status: DC
  Administered 2014-03-08: 40 mg via ORAL
  Filled 2014-03-07: qty 1

## 2014-03-07 MED ORDER — METHYLPREDNISOLONE SODIUM SUCC 125 MG IJ SOLR
125.0000 mg | INTRAMUSCULAR | Status: AC
  Administered 2014-03-07: 125 mg via INTRAVENOUS

## 2014-03-07 MED ORDER — DIPHENHYDRAMINE HCL 50 MG/ML IJ SOLN
INTRAMUSCULAR | Status: AC
  Administered 2014-03-07: 25 mg via INTRAVENOUS
  Filled 2014-03-07: qty 1

## 2014-03-07 MED ORDER — ASPIRIN 81 MG PO CHEW: 81.0000 mg | CHEWABLE_TABLET | ORAL | Status: DC

## 2014-03-07 MED ORDER — METHYLPREDNISOLONE SODIUM SUCC 125 MG IJ SOLR
INTRAMUSCULAR | Status: AC
  Administered 2014-03-07: 125 mg via INTRAVENOUS
  Filled 2014-03-07: qty 2

## 2014-03-07 MED ORDER — MAGNESIUM HYDROXIDE 400 MG/5ML PO SUSP
30.0000 mL | Freq: Every day | ORAL | Status: DC | PRN
  Administered 2014-03-08: 01:00:00 30 mL via ORAL
  Filled 2014-03-07: qty 30

## 2014-03-07 MED ORDER — ACETAMINOPHEN 325 MG PO TABS: 650.0000 mg | ORAL_TABLET | ORAL | Status: DC | PRN

## 2014-03-07 MED ORDER — ASPIRIN EC 325 MG PO TBEC
325.0000 mg | DELAYED_RELEASE_TABLET | Freq: Every day | ORAL | Status: DC
  Administered 2014-03-08: 325 mg via ORAL
  Filled 2014-03-07: qty 1

## 2014-03-07 NOTE — Interval H&P Note (Signed)
History and Physical Interval Note:  03/07/2014 9:40 AM  [name]  has presented today for surgery, with the diagnosis of PAD  The various methods of treatment have been discussed with the patient and family. After consideration of risks, benefits and other options for treatment, the patient has consented to  Procedure(s): LOWER EXTREMITY ANGIOGRAM (N/A) as a surgical intervention .  The patient's history has been reviewed, patient examined, no change in status, stable for surgery.  I have reviewed the patient's chart and labs.  Questions were answered to the patient's satisfaction.     Runell GessBERRY,Vaeda Westall J

## 2014-03-07 NOTE — H&P (View-Only) (Signed)
02/16/2014 [name]   [birth year]  [id number]  Primary Physician No PCP Per Patient Primary Cardiologist: Runell GessJonathan J. Danyka Merlin MD Roseanne RenoFACP,FACC,FAHA, FSCAI   HPI:  The patient is an 80 year old mildly to moderately overweight divorced African American female, mother of 4 and grandmother to 8 grandchildren, whom I last saw 12 months ago. She has a history of CAD with multiple interventions in her circumflex as well as her distal right coronary artery in the past. She was last catheterized by Dr. Clarene DukeLittle February 17, 2007, revealing minimal progression of her disease.   Her other problems include PVOD with stenting of her left mid and distal SFA with improvement in her claudication as well as her Dopplers. I performed Diamondback orbital rotational atherectomy, PTA and stenting of her right SFA using a S.M.A.R.T. nitinol self-expanding stent January 07, 2011, which resulted in improvement in her Dopplers and claudication. She is otherwise currently asymptomatic. Her other problems include hypertension, hyperlipidemia, and moderate right internal carotid artery stenosis which we are following by duplex ultrasound and which has remained stable. She is neurologically asymptomatic. She is stain intolerant. We discussed the possibility of monoclonal injectables such as PCS K9.  And I saw her a year ago she's noticed increasing right calf claudication with Dopplers to show progression of disease in her right popliteal artery. She denies chest pain or shortness of breath     Current Outpatient Prescriptions  Medication Sig Dispense Refill  . amLODipine (NORVASC) 5 MG tablet Take 5 mg by mouth daily.      Marland Kitchen. aspirin 325 MG tablet Take 325 mg by mouth daily.      . dorzolamide-timolol (COSOPT) 22.3-6.8 MG/ML ophthalmic solution Place 1 drop into both eyes 2 (two) times daily.      . fish oil-omega-3 fatty acids 1000 MG capsule Take 2 g by mouth daily.      . metoprolol succinate (TOPROL-XL) 50 MG 24  hr tablet Take 50 mg by mouth daily.      . Omega-3 Fatty Acids (FISH OIL) 1000 MG CAPS Take 2 g by mouth.      . timolol (TIMOPTIC) 0.5 % ophthalmic solution Apply 1 drop to eye 2 (two) times daily.      . traMADol (ULTRAM) 50 MG tablet Take 50 mg by mouth daily.       No current facility-administered medications for this visit.    No Known Allergies  History   Social History  . Marital Status: Divorced    Spouse Name: N/A    Number of Children: N/A  . Years of Education: N/A   Occupational History  . Not on file.   Social History Main Topics  . Smoking status: Never Smoker   . Smokeless tobacco: Not on file  . Alcohol Use: No  . Drug Use: Not on file  . Sexual Activity: Not on file   Other Topics Concern  . Not on file   Social History Narrative  . No narrative on file     Review of Systems: General: negative for chills, fever, night sweats or weight changes.  Cardiovascular: negative for chest pain, dyspnea on exertion, edema, orthopnea, palpitations, paroxysmal nocturnal dyspnea or shortness of breath Dermatological: negative for rash Respiratory: negative for cough or wheezing Urologic: negative for hematuria Abdominal: negative for nausea, vomiting, diarrhea, bright red blood per rectum, melena, or hematemesis Neurologic: negative for visual changes, syncope, or dizziness All other systems reviewed and are otherwise negative except as noted above.  Blood pressure 182/94, pulse 67, height 4\' 11"  (1.499 m), weight 175 lb 3.2 oz (79.47 kg).  General appearance: alert and no distress Neck: no adenopathy, no JVD, supple, symmetrical, trachea midline, thyroid not enlarged, symmetric, no tenderness/mass/nodules and soft right carotid bruit Lungs: clear to auscultation bilaterally Heart: regular rate and rhythm, S1, S2 normal, no murmur, click, rub or gallop Extremities: extremities normal, atraumatic, no cyanosis or edema  EKG normal sinus rhythm at 67 without  ST or T wave changes  ASSESSMENT AND PLAN:   Coronary artery disease History of CAD status post multiple interventions on her circumflex and distal RCA in the past. Her last catheterization performed by Dr. Clarene DukeLittle 02/17/07 revealed minimal progression of her disease. She denies chest pain or shortness of breath.  Peripheral arterial disease History of peripheral arterial disease status post stenting of her mid and distal left SFA in the past. I performed diamondback orbital or tissue atherectomy, PTA and stenting of the right SFA using a Smart diagnosed with expanding stent 01/07/11 resulting improvement in her symptoms and Dopplers. She has developed left bowel limiting claudication in her right calf. Recent Dopplers performed 12/22/13 revealed progression of disease the right popliteal artery with decrease in her right ABI of 0.89. We talked about readjusting intervention which she will consider.  Essential hypertension Her blood pressure is elevated today. I am going to increase the losartan from 50-100 mg a day and will have her check daily blood pressures and keep a log. She will see Belenda CruiseKristin back in the office 1 month for review and  Carotid artery disease Moderate right internal carotid artery stenosis which has remained stable. She is neurologically asymptomatic. We followed this by duplex ultrasound.  Hyperlipidemia She is statin intolerant. We will discuss the option of monoclonal injectables.      Runell GessJonathan J. Karmela Bram MD FACP,FACC,FAHA, FSCAI 02/16/2014 3:00 PM   Addendum: As well as and has decided to proceed with peripheral angiography and endovascular therapy of her right popliteal artery lifestyle limiting claudication. We will schedule this in the near future.

## 2014-03-07 NOTE — Progress Notes (Signed)
Site area: left groin  Site Prior to Removal:  Level 0  Pressure Applied For 20 MINUTES    Minutes Beginning at 1420  Manual:   Yes.    Patient Status During Pull:  See comment  Post Pull Groin Site:  Level 0  Post Pull Instructions Given:  Yes.    Post Pull Pulses Present:  Yes.    Dressing Applied:  Yes.    Comments:  Patient vagaled with sheath pull and hold; SBP dropped to 70's and experienced chest pain.  NS bolus given; patient placed in T Berg.  12 lead EKG done.  Corine ShelterLuke Kilroy, [place] called and examined patient and EKG.  GI cocktail given.  Patient recovered quickly to baseline.  Resting comfortably and eating by 1530 with family at bedside.

## 2014-03-07 NOTE — CV Procedure (Signed)
[name] is a 80 y.o. female    [id number] LOCATION:  FACILITY: [facility]  PHYSICIAN: Nanetta BattyJonathan Maylen Waltermire, M.D. 10/18/1928   DATE OF PROCEDURE:  03/07/2014  DATE OF DISCHARGE:     PV Angiogram/Intervention    History obtained from chart review.The patient is an 80 year old mildly to moderately overweight divorced [place] female, mother of 4 and grandmother to 8 grandchildren, whom I last saw 12 months ago. She has a history of CAD with multiple interventions in her circumflex as well as her distal right coronary artery in the past. She was last catheterized by Dr. Clarene DukeLittle February 17, 2007, revealing minimal progression of her disease.   Her other problems include PVOD with stenting of her left mid and distal SFA with improvement in her claudication as well as her Dopplers. I performed Diamondback orbital rotational atherectomy, PTA and stenting of her right SFA using a S.M.A.R.T. nitinol self-expanding stent January 07, 2011, which resulted in improvement in her Dopplers and claudication. She is otherwise currently asymptomatic. Her other problems include hypertension, hyperlipidemia, and moderate right internal carotid artery stenosis which we are following by duplex ultrasound and which has remained stable. She is neurologically asymptomatic. She is stain intolerant. We discussed the possibility of monoclonal injectables such as PCS K9.  Since I saw her a year ago she's noticed increasing right calf claudication with Dopplers to show progression of disease in her right popliteal artery. She denies chest pain or shortness of breath. She presents now for angiography and potential percutaneous intervention for lifestyle limiting claudication.    PROCEDURE DESCRIPTION:   The patient was brought to the second floor [facility] cath lab in the postabsorptive state.  She was premedicated with Valium 5 mg by mouth, IV Versed and fentanyl. She was also premedicated for contrast  allergy with IV Benadryl, Pepcid and Solu-Medrol.. Her left groinwas prepped and shaved in usual sterile fashion. Xylocaine 1% was used for local anesthesia. A 5 French sheath was inserted into the left common femoral artery using standard Seldinger technique. A 5 French pigtail catheter was placed in the distal thumb aorta. Distal abdominal aortography, bilateral iliac angiography with bifemoral runoff using bolus chase digital subtraction step table technique was performed. Omnipaque dye was used for the entirety of the case. Retrograde aortic pressures were recorded.  HEMODYNAMICS:    AO SYSTOLIC/AO DIASTOLIC: 172/61   Angiographic Data:   1: Abdominal aorta-the distal abdominal aorta was free of significant disease  2: Left lower extremity-there were 2 patent stents in the mid and distal left SFA. There was a 75% fairly focal lesion in the mid left SFA proximal to the first stent. There was one vessel runoff via a posterior tibial artery. There was a 95% tibioperoneal trunk and proximal posterior tibial artery stenosis.  3: Right lower extremity-there was a patent stent within the mid right SFA that had moderate growth in-stent restenosis. Just distal to this there was a 75-80% fairly focal lesion. There was a a 90% lesion in the P2 segment of the popliteal artery with one vessel runoff via the posterior tibial that had a 95% proximal stenosis.  IMPRESSION:[name] has moderate "in-stent restenosis" within the right SFA stent followed by high-grade disease just distal to this as well as in the popliteal artery. We'll proceed with PTA using "chocolate balloon" followed by drug eluding balloon angioplasty of the SFA.  Procedure Description:contralateral access as a teen with a 5 [place]French crossover catheter and a 7 [place]French multipurpose 45 cm  destination sheath over an 035 Versicore wire. The patient received a total of 6500 units of heparin with an ACT of 225. Total contrast used during the case  was 184 cc. I crossed the SFA lesion with an 014/300 cm long [place] core wire and a 5 mm x 40 mm long chocolate balloon. I performed 3 minute inflations in the mid SFA lesion as well as within the stent up to 8 atmospheres. Following this across the popliteal lesion with the same wire and performed PTCA with a 3.5 mm x 80 mm long chocolate balloon at normal pressures for 3 minutes. I then used a 5 mm 100 mm long Lutonix drug-eluting balloon to complete the final angioplasty in the mid right SFA. The final angiographic result with reduction of a 75% mid right SFA stenosis as well as moderate in-stent restenosis to less than 10% residual. In addition, the popliteal artery result was reduction of a 90% popliteal artery stenosis (P2 segment) to present to less than 10% residual without dissection. The 7 French sheath was then withdrawn across the bifurcation and exchanged over the 35 wire for a short 7 [place] sheath. This was secured in place. The patient left the lab in stable condition. The sheath will be removed once the ACT falls below 170 and pressure will be held. The patient will be treated with double phototherapy. She will remain recumbent for 6 hours and will be hydrated overnight.  Final Impression: successful chocolate balloon angioplasty of the mid right SFA and right popliteal artery with drug eluting balloon angioplasty of the mid right SFA with excellent angiographic results for lifestyle limiting claudication. The patient will be discharged home in the morning. She'll get followup lower extremity arterial Doppler [facility] after which I will see her back.    Paula GessBERRY,Paula Goldammer J. MD, [facility]sFACC 03/07/2014 11:23 AM

## 2014-03-07 NOTE — Care Management Note (Addendum)
  Page 1 of 1   03/08/2014     12:44:07 PM CARE MANAGEMENT NOTE 03/08/2014  Patient:  [name],[name]   Account Number:  [id number]  Date Initiated:  03/07/2014  Documentation initiated by:  Coolidge Gossard  Subjective/Objective Assessment:   PAD     Action/Plan:   CM to follow for dispostion needs   Anticipated DC Date:  03/08/2014   Anticipated DC Plan:  HOME/SELF CARE         Choice offered to / List presented to:             Status of service:  Completed, signed off Medicare Important Message given?  NA - LOS <3 / Initial given by admissions (If response is "NO", the following Medicare IM given date fields will be blank) Date Medicare IM given:   Medicare IM given by:   Date Additional Medicare IM given:   Additional Medicare IM given by:    Discharge Disposition:  HOME/SELF CARE  Per UR Regulation:    If discussed at Long Length of Stay Meetings, dates discussed:    Comments:  Bess Saltzman RN, BSN, MSHL, CCM  Nurse - Case Manager,  (Unit [phone])  [phone]  03/07/2014 Med Review:  Plavix Dispo Plan:  home / self care CM will continue to monitor for disposition needs

## 2014-03-07 NOTE — Progress Notes (Signed)
Called to see pt for chest pain. Pt was hypertensive before her sheath pull and was given Hydralazine x 2 and MSO4. Her B/P dropped during her sheath pull and she developed mid sternal chest chest pain. She looks comfortable currently, B/P 120/74, and her pain is improved but still present after her head was raised a little. EKG shows no acute changes. She does have a history of remote CFX disease and prior PCI. I will cycle Troponin. Try GI cocktail x 1, hesitant to use SL NTG with recent hypotension but will try this if GI cocktail and raising her head a little doesn't relieve her chest pain.   Corine ShelterLUKE Zamani Crocker PA-C 03/07/2014 2:45 PM

## 2014-03-08 ENCOUNTER — Encounter (HOSPITAL_COMMUNITY): Payer: Self-pay | Admitting: Cardiology

## 2014-03-08 ENCOUNTER — Other Ambulatory Visit: Payer: Self-pay | Admitting: Cardiology

## 2014-03-08 DIAGNOSIS — E785 Hyperlipidemia, unspecified: Secondary | ICD-10-CM

## 2014-03-08 DIAGNOSIS — Z95828 Presence of other vascular implants and grafts: Secondary | ICD-10-CM

## 2014-03-08 DIAGNOSIS — T82858A Stenosis of vascular prosthetic devices, implants and grafts, initial encounter: Secondary | ICD-10-CM | POA: Diagnosis not present

## 2014-03-08 DIAGNOSIS — I739 Peripheral vascular disease, unspecified: Secondary | ICD-10-CM

## 2014-03-08 DIAGNOSIS — I10 Essential (primary) hypertension: Secondary | ICD-10-CM

## 2014-03-08 DIAGNOSIS — I70211 Atherosclerosis of native arteries of extremities with intermittent claudication, right leg: Secondary | ICD-10-CM | POA: Diagnosis not present

## 2014-03-08 DIAGNOSIS — E663 Overweight: Secondary | ICD-10-CM | POA: Diagnosis not present

## 2014-03-08 DIAGNOSIS — Z95820 Peripheral vascular angioplasty status with implants and grafts: Secondary | ICD-10-CM | POA: Diagnosis not present

## 2014-03-08 LAB — BASIC METABOLIC PANEL
ANION GAP: 17 — AB (ref 5–15)
BUN: 33 mg/dL — ABNORMAL HIGH (ref 6–23)
CALCIUM: 9.5 mg/dL (ref 8.4–10.5)
CO2: 22 meq/L (ref 19–32)
CREATININE: 1.33 mg/dL — AB (ref 0.50–1.10)
Chloride: 100 mEq/L (ref 96–112)
GFR calc non Af Amer: 36 mL/min — ABNORMAL LOW (ref >90)
GFR, EST AFRICAN AMERICAN: 42 mL/min — AB (ref >90)
Glucose, Bld: 234 mg/dL — ABNORMAL HIGH (ref 70–99)
Potassium: 4.3 mEq/L (ref 3.7–5.3)
Sodium: 139 mEq/L (ref 137–147)

## 2014-03-08 LAB — CBC
HEMATOCRIT: 36.9 % (ref 36.0–46.0)
Hemoglobin: 12.5 g/dL (ref 12.0–15.0)
MCH: 30.8 pg (ref 26.0–34.0)
MCHC: 33.9 g/dL (ref 30.0–36.0)
MCV: 90.9 fL (ref 78.0–100.0)
Platelets: 278 10*3/uL (ref 150–400)
RBC: 4.06 MIL/uL (ref 3.87–5.11)
RDW: 13.2 % (ref 11.5–15.5)
WBC: 11.8 10*3/uL — ABNORMAL HIGH (ref 4.0–10.5)

## 2014-03-08 MED ORDER — ASPIRIN 81 MG PO TBEC: 81.0000 mg | DELAYED_RELEASE_TABLET | Freq: Every day | ORAL | Status: AC

## 2014-03-08 MED ORDER — PANTOPRAZOLE SODIUM 40 MG PO TBEC: 40.0000 mg | DELAYED_RELEASE_TABLET | Freq: Every day | ORAL | Status: AC

## 2014-03-08 MED ORDER — AMLODIPINE BESYLATE 5 MG PO TABS
5.0000 mg | ORAL_TABLET | Freq: Every day | ORAL | Status: DC
Start: 2014-03-08 — End: 2014-03-08
  Administered 2014-03-08 (×2): 5 mg via ORAL
  Filled 2014-03-08 (×2): qty 1

## 2014-03-08 MED ORDER — ACETAMINOPHEN 325 MG PO TABS: 650.0000 mg | ORAL_TABLET | ORAL | Status: AC | PRN

## 2014-03-08 MED ORDER — CLOPIDOGREL BISULFATE 75 MG PO TABS: 75.0000 mg | ORAL_TABLET | Freq: Every day | ORAL | Status: DC

## 2014-03-08 MED ORDER — LOSARTAN POTASSIUM 50 MG PO TABS
100.0000 mg | ORAL_TABLET | Freq: Every day | ORAL | Status: DC
  Administered 2014-03-08 (×2): 100 mg via ORAL
  Filled 2014-03-08 (×2): qty 2

## 2014-03-08 NOTE — Discharge Instructions (Signed)
[facility] at [phone] if any bleeding, swelling or drainage at cath site.  May shower, no tub baths for 48 hours for groin sticks.   No driving for 3 days, no lifting over 5 pounds for 3 days.  Heart Healthy diet  Follow up doppler studies and appt will be made by the office.  You should hear from them

## 2014-03-08 NOTE — Discharge Summary (Signed)
Physician Discharge Summary       Patient ID: ELADIA FRAME MRN: [id number] DOB/AGE: January 28, 1929 80 y.o.  Admit date: 03/07/2014 Discharge date: 03/08/2014  Discharge Diagnoses:  Principal Problem:   Claudication, lifestyle limiting Active Problems:   Peripheral arterial disease- 03/07/14 successful chocolate balloon angioplasty of the mid right SFA and right popliteal artery with drug eluting balloon angioplasty of the mid right SFA with excellent angiographic results for lifestyle limiting claudication   Coronary artery disease   Essential hypertension   Hyperlipidemia   Discharged Condition: good  Procedures: PV angiogram 03/07/14 by Dr. Erlene Quan  03/07/14 successful chocolate balloon angioplasty of the mid right SFA and right popliteal artery with drug eluting balloon angioplasty of the mid right SFA with excellent angiographic results for lifestyle limiting claudication by Dr. Allyson Sabal.   Hospital Course: 80 year old mildly to moderately overweight divorced [place] American female, mother of 4 and grandmother to 8 grandchildren. She has a history of CAD with multiple interventions in her circumflex as well as her distal right coronary artery in the past. She was last catheterized by Dr. Clarene Duke February 17, 2007, revealing minimal progression of her disease.   Her other problems include PVOD with stenting of her left mid and distal SFA with improvement in her claudication as well as her Dopplers. Dr. Erlene Quan performed [facility] orbital rotational atherectomy, PTA and stenting of her right SFA using a S.M.A.R.T. nitinol self-expanding stent January 07, 2011, which resulted in improvement in her Dopplers and claudication. She is otherwise currently asymptomatic. Her other problems include hypertension, hyperlipidemia, and moderate right internal carotid artery stenosis which we are following by duplex ultrasound and which has remained stable. She is neurologically asymptomatic.  She is stain intolerant. Dr. Erlene Quan  discussed the possibility of monoclonal injectables such as PCS K9.  Since she was seen a year ago she's noticed increasing right calf claudication with Dopplers to show progression of disease in her right popliteal artery. She denies chest pain or shortness of breath. She present for elective angiography and potential percutaneous intervention for lifestyle limiting claudication.   She was found to have moderate "in-stent restenosis" within the right SFA stent followed by high-grade disease just distal to this as well as in the popliteal artery. Dr. Erlene Quan proceeded with PTA using "chocolate balloon" followed by drug eluding balloon angioplasty of the SFA.  She did have chest pain post procedure, she was hypertensive but after hydralazine and morphine her BP dropped and she developed the chest pain.   No acute EKG changes.   She does have a history of remote CFX disease and prior PCI. Troponins were negative. Symptoms resolved.  By the AM of discharge she was seen by Dr. Ranae Palms and was stable for discharge,  ambulated without complaints.  She will have follow up doppler study of Rt leg and follow up with Dr. Erlene Quan.     Consults: None  Significant Diagnostic Studies:  BMET    Component Value Date/Time   NA 139 03/08/2014 0416   NA 139 02/28/2014 1331   K 4.3 03/08/2014 0416   CL 100 03/08/2014 0416   CO2 22 03/08/2014 0416   GLUCOSE 234* 03/08/2014 0416   GLUCOSE 97 02/28/2014 1331   BUN 33* 03/08/2014 0416   BUN 18 02/28/2014 1331   CREATININE 1.33* 03/08/2014 0416   CALCIUM 9.5 03/08/2014 0416   GFRNONAA 36* 03/08/2014 0416   GFRAA 42* 03/08/2014 0416       Discharge Exam:  Blood pressure 158/66, pulse 93, temperature 98.1 F (36.7 C), temperature source Oral, resp. rate 18, height 4\' 11"  (1.499 m), weight 173 lb 11.6 oz (78.8 kg), SpO2 98.00%.  Per MD: General:Pleasant affect, NAD Skin:Warm and dry, brisk capillary  refill HEENT:normocephalic, sclera clear, mucus membranes moist Neck:supple, no JVD  Heart:S1S2 RRR without murmur, gallup, rub or click Lungs:clear without rales, rhonchi, or wheezes UJW:JXBJAbd:soft, non tender, + BS, do not palpate liver spleen or masses Ext:no lower ext edema, 2+ radial pulses Neuro:alert and oriented, MAE, follows commands, + facial symmetry   Disposition: 01-Home or Self Care     Medication List    STOP taking these medications       aspirin 325 MG tablet  Replaced by:  aspirin 81 MG EC tablet      TAKE these medications       acetaminophen 325 MG tablet  Commonly known as:  TYLENOL  Take 2 tablets (650 mg total) by mouth every 4 (four) hours as needed for headache or mild pain.     amLODipine 5 MG tablet  Commonly known as:  NORVASC  Take 5 mg by mouth daily.     aspirin 81 MG EC tablet  Take 1 tablet (81 mg total) by mouth daily.     BIOFREEZE EX  Apply 1 application topically as needed (for pain).     CENTRUM SILVER ULTRA WOMENS PO  Take 1 tablet by mouth daily.     clopidogrel 75 MG tablet  Commonly known as:  PLAVIX  Take 1 tablet (75 mg total) by mouth daily with breakfast.     losartan-hydrochlorothiazide 100-25 MG per tablet  Commonly known as:  HYZAAR  Take 1 tablet by mouth daily.     metoprolol succinate 50 MG 24 hr tablet  Commonly known as:  TOPROL-XL  Take 50 mg by mouth daily.     pantoprazole 40 MG tablet  Commonly known as:  PROTONIX  Take 1 tablet (40 mg total) by mouth daily at 6 (six) AM.     timolol 0.5 % ophthalmic solution  Commonly known as:  TIMOPTIC  Place 1 drop into both eyes 2 (two) times daily.       Follow-up Information   Follow up with Runell GessBERRY,JONATHAN J, MD. (for rt lower ext art. doppler. )    Specialty:  Cardiology   Contact information:   [address] [place] [address] [phone]       Follow up with Runell GessBERRY,JONATHAN J, MD. (the office will call for the doppler study and the  appt.)    Specialty:  Cardiology   Contact information:   [address] [place] [address] [phone]        Discharge Instructions: [facility] at [phone] if any bleeding, swelling or drainage at cath site.  May shower, no tub baths for 48 hours for groin sticks.   No driving for 3 days, no lifting over 5 pounds for 3 days.  Heart Healthy diet  Follow up doppler studies and appt will be made by the office.  You should hear from them Signed: ZDGUYQ,IHKVQNGOLD,LAURA R Nurse Practitioner-Certified [facility]: [facility] 03/08/2014, 10:25 AM  Time spent on discharge :>30 minutes.     I saw & examined the patient on the AM of d/c s/p RFA PTA.  Stable post procedure.  No groin complication.  Ready for d/c with f/u per protocol.  I agree with the d/c summary  as discussed with Ms. Ingold.  Chrissi Crow W, MD=

## 2014-03-14 ENCOUNTER — Telehealth: Payer: Self-pay | Admitting: Cardiovascular Disease

## 2014-03-14 NOTE — Telephone Encounter (Signed)
Start her on Zontivity instead

## 2014-03-14 NOTE — Telephone Encounter (Signed)
Closed encounter °

## 2014-03-14 NOTE — Telephone Encounter (Signed)
Dr Allyson SabalBerry, please advise.  Any other medications you would like to try?

## 2014-03-14 NOTE — Telephone Encounter (Signed)
Pt called in stating that [name] put her on Clopedegril and she is allergic to it and would like to find another prescription. Please call  Thanks

## 2014-03-14 NOTE — Telephone Encounter (Signed)
OK 

## 2014-03-14 NOTE — Telephone Encounter (Signed)
Spoke to patient She states she stated clopidogrel she develop a little rash and itching all over. She started on Saturday  No problem with breathing. RN informed patient to stopped taking Clopidogrel at present time Patient states she did not take today's dose.  RN informed patient she may use benadryl for the itching , any difficult with breathing or swallowing call 911 WILL DEFER TO DR Rockie NeighboursBERRY /KATHRYN RN

## 2014-03-16 MED ORDER — VORAPAXAR SULFATE 2.08 MG PO TABS: 2.0800 mg | ORAL_TABLET | Freq: Every day | ORAL | Status: DC

## 2014-03-16 NOTE — Addendum Note (Signed)
Addended byMarella Bile: Hillery Bhalla W. on: 03/16/2014 03:02 PM   Modules accepted: Orders, Medications

## 2014-03-16 NOTE — Telephone Encounter (Signed)
I called patient and she is agreeable to try Zontivity 2.08mg  daily.  She will pick up samples next Wednesday when she is in the office.  I will reach out to [employer] to help with medication assistance through the company.

## 2014-03-17 NOTE — Telephone Encounter (Signed)
[name] - I'm not sure they have any assistance programs at [employer], but I have put in a request for "vouchers and samples", so I'll see what comes of it.  I'll let you know.

## 2014-03-22 ENCOUNTER — Telehealth (HOSPITAL_COMMUNITY): Payer: Self-pay | Admitting: *Deleted

## 2014-03-23 ENCOUNTER — Encounter (HOSPITAL_COMMUNITY): Payer: Medicare Other

## 2014-03-25 ENCOUNTER — Ambulatory Visit (HOSPITAL_COMMUNITY)
Admission: RE | Admit: 2014-03-25 | Discharge: 2014-03-25 | Disposition: A | Discharge status: Home / Self Care | Payer: Medicare Other | Source: Ambulatory Visit | Attending: Cardiology | Admitting: Cardiology

## 2014-03-25 DIAGNOSIS — Z95828 Presence of other vascular implants and grafts: Secondary | ICD-10-CM

## 2014-03-25 DIAGNOSIS — Z789 Other specified health status: Secondary | ICD-10-CM | POA: Insufficient documentation

## 2014-03-25 DIAGNOSIS — I739 Peripheral vascular disease, unspecified: Secondary | ICD-10-CM

## 2014-03-25 NOTE — Progress Notes (Signed)
Right Lower Extremity Arterial Duplex Completed. °Brianna L Mazza,RVT °

## 2014-03-29 ENCOUNTER — Encounter: Payer: Self-pay | Admitting: Physician Assistant

## 2014-03-29 ENCOUNTER — Ambulatory Visit (INDEPENDENT_AMBULATORY_CARE_PROVIDER_SITE_OTHER): Payer: Medicare Other | Admitting: Physician Assistant

## 2014-03-29 VITALS — BP 178/80 | HR 66 | Ht 60.0 in | Wt 175.1 lb

## 2014-03-29 DIAGNOSIS — I10 Essential (primary) hypertension: Secondary | ICD-10-CM

## 2014-03-29 DIAGNOSIS — I6529 Occlusion and stenosis of unspecified carotid artery: Secondary | ICD-10-CM

## 2014-03-29 DIAGNOSIS — I739 Peripheral vascular disease, unspecified: Secondary | ICD-10-CM

## 2014-03-29 MED ORDER — AMLODIPINE BESYLATE 10 MG PO TABS: 10.0000 mg | ORAL_TABLET | Freq: Every day | ORAL | Status: AC

## 2014-03-29 NOTE — Progress Notes (Signed)
Date:  03/29/2014   ID:  [name], DOB 11-24-1928, MRN [id number]  PCP:  Ward, Ellison Hughsemming M, MD  Primary Cardiologist:  Allyson SabalBerry    History of Present Illness: [name] is a 80 y.o. female 80 year old mildly to moderately overweight divorced [place] female, mother of 4 and grandmother to 8 grandchildren. She has a history of CAD with multiple interventions in her circumflex as well as her distal right coronary artery in the past. She was last catheterized by Dr. Clarene DukeLittle February 17, 2007, revealing minimal progression of her disease.   Her other problems include PVOD with stenting of her left mid and distal SFA with improvement in her claudication as well as her Dopplers. Dr. Erlene QuanJ. Berry [facility], PTA and stenting of her right SFA using a S.M.A.R.T. nitinol self-expanding stent January 07, 2011, which resulted in improvement in her Dopplers and claudication.  Her other problems include hypertension, hyperlipidemia, and moderate right internal carotid artery stenosis which we are following by duplex ultrasound and which has remained stable.  She is stain intolerant. Dr. Shela CommonsJ. Berrydiscussed the possibility of monoclonal injectables such as PCS K9.  Recent dopplers to show progression of disease in her right popliteal artery. She presented for elective angiography and potential percutaneous intervention for lifestyle limiting claudication.  She was found to have moderate "in-stent restenosis" within the right SFA stent followed by high-grade disease just distal to this as well as in the popliteal artery. Dr.  Allyson SabalBerry proceeded with PTA using "chocolate balloon" followed by drug eluding balloon angioplasty of the SFA.   She did have chest pain post procedure, she was hypertensive but after hydralazine and morphine her BP dropped and she developed the chest pain. No acute EKG changes. She does have a history of remote CFX disease and prior  PCI. Troponins were negative. Symptoms resolved.  She presents today for follow up.  She is doing well.  Claudication has resolved.  She denies nausea, vomiting, fever, chest pain, shortness of breath, orthopnea, dizziness, PND, cough, congestion, abdominal pain, hematochezia, melena, lower extremity edema.  Wt Readings from Last 3 Encounters:  03/29/14 175 lb 1.6 oz (79.425 kg)  03/08/14 173 lb 11.6 oz (78.8 kg)  02/16/14 175 lb 3.2 oz (79.47 kg)     Past Medical History  Diagnosis Date  . Carotid artery disease   . Peripheral arterial disease   . Hypertension   . Hyperlipidemia   . Coronary artery disease   . Arthritis     hip & knee  . Carotid artery stenosis     Current Outpatient Prescriptions  Medication Sig Dispense Refill  . acetaminophen (TYLENOL) 325 MG tablet Take 2 tablets (650 mg total) by mouth every 4 (four) hours as needed for headache or mild pain.    Marland Kitchen. amLODipine (NORVASC) 10 MG tablet Take 1 tablet (10 mg total) by mouth daily. 30 tablet 6  . aspirin EC 81 MG EC tablet Take 1 tablet (81 mg total) by mouth daily. 30 tablet 0  . losartan-hydrochlorothiazide (HYZAAR) 100-25 MG per tablet Take 1 tablet by mouth daily. 30 tablet 11  . Menthol, Topical Analgesic, (BIOFREEZE EX) Apply 1 application topically as needed (for pain).    . metoprolol succinate (TOPROL-XL) 50 MG 24 hr tablet Take 50 mg by mouth daily.    . Multiple Vitamins-Minerals (CENTRUM SILVER ULTRA WOMENS PO) Take 1 tablet by mouth daily.    . pantoprazole (PROTONIX) 40 MG tablet Take 1  tablet (40 mg total) by mouth daily at 6 (six) AM. 30 tablet 3  . timolol (TIMOPTIC) 0.5 % ophthalmic solution Place 1 drop into both eyes 2 (two) times daily.     . Vorapaxar Sulfate (ZONTIVITY) 2.08 MG TABS Take 2.08 mg by mouth daily. 30 tablet 0   No current facility-administered medications for this visit.    Allergies:    Allergies  Allergen Reactions  . Contrast Media [Iodinated Diagnostic Agents] Hives  .  Scallops [Shellfish Allergy] Swelling    Tongue swelling   . Plavix [Clopidogrel Bisulfate] Itching and Rash    Social History:  The patient  reports that she has never smoked. She has never used smokeless tobacco. She reports that she does not drink alcohol or use illicit drugs.   Family history:   Family History  Problem Relation Age of Onset  . Heart disease Sister     ROS:  Please see the history of present illness.  All other systems reviewed and negative.   PHYSICAL EXAM: VS:  BP 178/80 mmHg  Pulse 66  Ht 5' (1.524 m)  Wt 175 lb 1.6 oz (79.425 kg)  BMI 34.20 kg/m2 Obese, well developed, in no acute distress HEENT: Pupils are equal round react to light accommodation extraocular movements are intact.  Neck: no JVDNo cervical lymphadenopathy. Cardiac: Regular rate and rhythm without murmurs rubs or gallops. Lungs:  clear to auscultation bilaterally, no wheezing, rhonchi or rales Abd: soft, nontender, positive bowel sounds all quadrants. Ext: no lower extremity edema.  2+ radial and dorsalis pedis pulses. Skin: warm and dry.  No hematoma in the left groin Neuro:  Grossly normal  EKG: None  ASSESSMENT AND PLAN:  Problem List Items Addressed This Visit    Essential hypertension - Primary    BP elevated and running 160's at home.  Will increased amlodipine to 10mg .  She has an appt with her PCP within a couple weeks.  Follow up here in 6 months.    Relevant Medications      amLODIpine (NORVASC) tablet   Peripheral arterial disease    Recent successful chocolate balloon angioplasty of the mid right SFA and right popliteal artery with drug eluting balloon angioplasty of the mid right SFA.  Her claudication has resolved and she feels good.  No complications in the groin.  LEA dopplers have not results.  Will review.  FU in six months.  Allergy to plavix.  Was changed to Zontivity by Dr. Allyson SabalBerry    Relevant Medications      amLODIpine (NORVASC) tablet

## 2014-03-29 NOTE — Assessment & Plan Note (Signed)
BP elevated and running 160's at home.  Will increased amlodipine to 10mg .  She has an appt with her PCP within a couple weeks.  Follow up here in 6 months.

## 2014-03-29 NOTE — Assessment & Plan Note (Addendum)
Recent successful chocolate balloon angioplasty of the mid right SFA and right popliteal artery with drug eluting balloon angioplasty of the mid right SFA.  Her claudication has resolved and she feels good.  No complications in the groin.  LEA dopplers have not results.  Will review.  FU in six months.  Allergy to plavix.  Was changed to Zontivity by Dr. Allyson SabalBerry

## 2014-03-29 NOTE — Patient Instructions (Addendum)
Increase amlodipine to 10mg  daily.    Your physician recommends that you schedule a follow-up appointment in: 2 weeks for Blood Pressure evaluation with the pharmacist, Belenda CruiseKristin.   Follow up with Dr. Allyson SabalBerry in 6 months.

## 2014-04-01 ENCOUNTER — Encounter: Payer: Self-pay | Admitting: *Deleted

## 2014-04-19 ENCOUNTER — Telehealth: Payer: Self-pay | Admitting: Cardiovascular Disease

## 2014-04-19 NOTE — Telephone Encounter (Signed)
Belenda CruiseKristin, have you heard anything about an approval for zontivity?

## 2014-04-19 NOTE — Telephone Encounter (Signed)
I lm for patient that samples are at front desk.  I will follow up with Belenda CruiseKristin to determine the status of the patient assistance form.

## 2014-04-19 NOTE — Telephone Encounter (Signed)
Was this the patient we had send in the patient assistance forms from [employer] for?  If so I can try to call them tomorrow and see what their hold-up is.

## 2014-04-19 NOTE — Telephone Encounter (Signed)
Please have Samara DeistKathryn to call her. This is concerning the forms she filled out so she can help with her Zonpivity. She only have 5 pills left.

## 2014-04-20 NOTE — Telephone Encounter (Signed)
-----   Message from Marella BileKathryn W Vogel, RN sent at 04/19/2014  2:55 PM EST ----- Yes, that is the [employer] form that we faxed in.    Thanks!

## 2014-04-20 NOTE — Telephone Encounter (Signed)
Spoke with [employer] PA then patient.  Pt should receive rejection letter in mail this week.  She needs to sign and mark a box stating that she cannot afford the medication then mail back to [employer].  Explained this to patient.  Pt voiced understanding.  Advised that she make photocopy of documents before mailing back to [employer].

## 2014-04-28 ENCOUNTER — Encounter (HOSPITAL_COMMUNITY): Payer: Self-pay | Admitting: Cardiovascular Disease

## 2014-05-09 ENCOUNTER — Other Ambulatory Visit: Payer: Self-pay | Admitting: Cardiovascular Disease

## 2014-05-09 MED ORDER — VORAPAXAR SULFATE 2.08 MG PO TABS: 2.0800 mg | ORAL_TABLET | Freq: Every day | ORAL | Status: AC

## 2014-05-09 NOTE — Telephone Encounter (Signed)
Pt need some samples of Zontitivy please.

## 2014-05-09 NOTE — Telephone Encounter (Signed)
Samples put at front desk. Pt informed via telephone. She voices understanding.

## 2014-06-07 ENCOUNTER — Telehealth: Payer: Self-pay | Admitting: Pharmacist Clinician (PhC)/ Clinical Pharmacy Specialist

## 2014-06-07 NOTE — Telephone Encounter (Signed)
[facility], they need to know about her meds - pt couldn't recall name of med she gets from [employer]  Returned call, pt taking Zontivity

## 2014-07-18 ENCOUNTER — Telehealth: Payer: Self-pay | Admitting: Cardiovascular Disease

## 2014-07-18 NOTE — Telephone Encounter (Signed)
Please let Dr Allyson SabalBerry know pt expired yesterday(07-07-2014)please.

## 2014-07-18 NOTE — Telephone Encounter (Signed)
Pt expired on 07-31-2014

## 2014-07-18 NOTE — Telephone Encounter (Signed)
FYI

## 2014-07-19 DEATH — deceased

## 2014-08-19 DEATH — deceased
